# Patient Record
Sex: Female | Born: 1955 | Race: White | Hispanic: No | Marital: Married | Smoking: Current every day smoker
Health system: Southern US, Community
[De-identification: ages and names within clinical notes are randomized; demographics above are authoritative.]

## PROBLEM LIST (undated history)

## (undated) DIAGNOSIS — K635 Polyp of colon: Secondary | ICD-10-CM

## (undated) DIAGNOSIS — K648 Other hemorrhoids: Principal | ICD-10-CM

## (undated) DIAGNOSIS — J449 Chronic obstructive pulmonary disease, unspecified: Secondary | ICD-10-CM

## (undated) DIAGNOSIS — F419 Anxiety disorder, unspecified: Secondary | ICD-10-CM

## (undated) DIAGNOSIS — M722 Plantar fascial fibromatosis: Secondary | ICD-10-CM

## (undated) DIAGNOSIS — R159 Full incontinence of feces: Secondary | ICD-10-CM

## (undated) DIAGNOSIS — N809 Endometriosis, unspecified: Secondary | ICD-10-CM

## (undated) DIAGNOSIS — E559 Vitamin D deficiency, unspecified: Secondary | ICD-10-CM

## (undated) DIAGNOSIS — R87619 Unspecified abnormal cytological findings in specimens from cervix uteri: Secondary | ICD-10-CM

## (undated) DIAGNOSIS — E785 Hyperlipidemia, unspecified: Secondary | ICD-10-CM

## (undated) DIAGNOSIS — F329 Major depressive disorder, single episode, unspecified: Secondary | ICD-10-CM

## (undated) DIAGNOSIS — M459 Ankylosing spondylitis of unspecified sites in spine: Secondary | ICD-10-CM

## (undated) DIAGNOSIS — M1990 Unspecified osteoarthritis, unspecified site: Secondary | ICD-10-CM

## (undated) DIAGNOSIS — F32A Depression, unspecified: Secondary | ICD-10-CM

## (undated) DIAGNOSIS — M810 Age-related osteoporosis without current pathological fracture: Secondary | ICD-10-CM

## (undated) DIAGNOSIS — F17200 Nicotine dependence, unspecified, uncomplicated: Secondary | ICD-10-CM

## (undated) DIAGNOSIS — E119 Type 2 diabetes mellitus without complications: Secondary | ICD-10-CM

## (undated) DIAGNOSIS — R32 Unspecified urinary incontinence: Secondary | ICD-10-CM

## (undated) DIAGNOSIS — Z86010 Personal history of colonic polyps: Secondary | ICD-10-CM

## (undated) DIAGNOSIS — M8580 Other specified disorders of bone density and structure, unspecified site: Secondary | ICD-10-CM

## (undated) DIAGNOSIS — K219 Gastro-esophageal reflux disease without esophagitis: Secondary | ICD-10-CM

## (undated) DIAGNOSIS — J45909 Unspecified asthma, uncomplicated: Secondary | ICD-10-CM

## (undated) DIAGNOSIS — G43909 Migraine, unspecified, not intractable, without status migrainosus: Secondary | ICD-10-CM

## (undated) HISTORY — DX: Unspecified urinary incontinence: R32

## (undated) HISTORY — DX: Polyp of colon: K63.5

## (undated) HISTORY — DX: Age-related osteoporosis without current pathological fracture: M81.0

## (undated) HISTORY — DX: Other specified disorders of bone density and structure, unspecified site: M85.80

## (undated) HISTORY — DX: Chronic obstructive pulmonary disease, unspecified: J44.9

## (undated) HISTORY — DX: Unspecified osteoarthritis, unspecified site: M19.90

## (undated) HISTORY — DX: Unspecified abnormal cytological findings in specimens from cervix uteri: R87.619

## (undated) HISTORY — DX: Endometriosis, unspecified: N80.9

## (undated) HISTORY — DX: Major depressive disorder, single episode, unspecified: F32.9

## (undated) HISTORY — DX: Vitamin D deficiency, unspecified: E55.9

## (undated) HISTORY — DX: Hyperlipidemia, unspecified: E78.5

## (undated) HISTORY — PX: COLONOSCOPY: SHX174

## (undated) HISTORY — DX: Personal history of colonic polyps: Z86.010

## (undated) HISTORY — DX: Nicotine dependence, unspecified, uncomplicated: F17.200

## (undated) HISTORY — DX: Type 2 diabetes mellitus without complications: E11.9

## (undated) HISTORY — DX: Plantar fascial fibromatosis: M72.2

## (undated) HISTORY — PX: TONSILLECTOMY: SUR1361

## (undated) HISTORY — DX: Full incontinence of feces: R15.9

## (undated) HISTORY — DX: Unspecified asthma, uncomplicated: J45.909

## (undated) HISTORY — PX: SHOULDER SURGERY: SHX246

## (undated) HISTORY — DX: Ankylosing spondylitis of unspecified sites in spine: M45.9

## (undated) HISTORY — DX: Other hemorrhoids: K64.8

## (undated) HISTORY — DX: Depression, unspecified: F32.A

## (undated) HISTORY — DX: Anxiety disorder, unspecified: F41.9

## (undated) HISTORY — DX: Gastro-esophageal reflux disease without esophagitis: K21.9

## (undated) HISTORY — PX: UPPER GASTROINTESTINAL ENDOSCOPY: SHX188

## (undated) HISTORY — PX: CRYOTHERAPY: SHX1416

## (undated) HISTORY — DX: Migraine, unspecified, not intractable, without status migrainosus: G43.909

## (undated) HISTORY — PX: SHOULDER ARTHROSCOPY: SHX128

---

## 1984-09-12 HISTORY — PX: TOTAL ABDOMINAL HYSTERECTOMY: SHX209

## 2006-11-14 ENCOUNTER — Ambulatory Visit (HOSPITAL_COMMUNITY): Admission: RE | Admit: 2006-11-14 | Discharge: 2006-11-14 | Payer: Self-pay | Admitting: Internal Medicine

## 2007-05-16 ENCOUNTER — Ambulatory Visit: Payer: Self-pay | Admitting: Vascular Surgery

## 2007-05-30 ENCOUNTER — Encounter: Admission: RE | Admit: 2007-05-30 | Discharge: 2007-05-30 | Payer: Self-pay | Admitting: Internal Medicine

## 2011-01-11 ENCOUNTER — Encounter (HOSPITAL_COMMUNITY): Payer: Self-pay

## 2011-01-19 ENCOUNTER — Ambulatory Visit (HOSPITAL_COMMUNITY)
Admission: RE | Admit: 2011-01-19 | Discharge: 2011-01-19 | Disposition: A | Discharge status: Home / Self Care | Payer: Federal, State, Local not specified - PPO | Source: Ambulatory Visit | Attending: Internal Medicine | Admitting: Internal Medicine

## 2011-01-19 DIAGNOSIS — R0602 Shortness of breath: Secondary | ICD-10-CM | POA: Insufficient documentation

## 2011-01-19 DIAGNOSIS — F17200 Nicotine dependence, unspecified, uncomplicated: Secondary | ICD-10-CM | POA: Insufficient documentation

## 2013-01-15 ENCOUNTER — Other Ambulatory Visit: Payer: Self-pay | Admitting: Internal Medicine

## 2013-01-15 DIAGNOSIS — Z1231 Encounter for screening mammogram for malignant neoplasm of breast: Secondary | ICD-10-CM

## 2013-02-21 ENCOUNTER — Ambulatory Visit
Admission: RE | Admit: 2013-02-21 | Discharge: 2013-02-21 | Disposition: A | Discharge status: Home / Self Care | Payer: Federal, State, Local not specified - PPO | Source: Ambulatory Visit | Attending: Internal Medicine | Admitting: Internal Medicine

## 2013-02-21 DIAGNOSIS — Z1231 Encounter for screening mammogram for malignant neoplasm of breast: Secondary | ICD-10-CM

## 2013-02-22 ENCOUNTER — Other Ambulatory Visit: Payer: Self-pay | Admitting: Internal Medicine

## 2013-02-22 DIAGNOSIS — R928 Other abnormal and inconclusive findings on diagnostic imaging of breast: Secondary | ICD-10-CM

## 2013-03-07 ENCOUNTER — Ambulatory Visit
Admission: RE | Admit: 2013-03-07 | Discharge: 2013-03-07 | Disposition: A | Discharge status: Home / Self Care | Payer: Federal, State, Local not specified - PPO | Source: Ambulatory Visit | Attending: Internal Medicine | Admitting: Internal Medicine

## 2013-03-07 DIAGNOSIS — R928 Other abnormal and inconclusive findings on diagnostic imaging of breast: Secondary | ICD-10-CM

## 2013-07-12 ENCOUNTER — Other Ambulatory Visit: Payer: Self-pay | Admitting: Internal Medicine

## 2013-07-12 DIAGNOSIS — R1011 Right upper quadrant pain: Secondary | ICD-10-CM

## 2013-07-15 ENCOUNTER — Ambulatory Visit
Admission: RE | Admit: 2013-07-15 | Discharge: 2013-07-15 | Disposition: A | Discharge status: Home / Self Care | Payer: Federal, State, Local not specified - PPO | Source: Ambulatory Visit | Attending: Internal Medicine | Admitting: Internal Medicine

## 2013-07-15 DIAGNOSIS — R1011 Right upper quadrant pain: Secondary | ICD-10-CM

## 2014-01-23 ENCOUNTER — Other Ambulatory Visit (HOSPITAL_COMMUNITY): Payer: Self-pay | Admitting: Internal Medicine

## 2014-01-23 ENCOUNTER — Other Ambulatory Visit: Payer: Self-pay | Admitting: Internal Medicine

## 2014-01-23 DIAGNOSIS — R1011 Right upper quadrant pain: Secondary | ICD-10-CM

## 2014-01-23 DIAGNOSIS — F17200 Nicotine dependence, unspecified, uncomplicated: Secondary | ICD-10-CM

## 2014-01-24 ENCOUNTER — Encounter: Payer: Self-pay | Admitting: Internal Medicine

## 2014-01-27 ENCOUNTER — Ambulatory Visit
Admission: RE | Admit: 2014-01-27 | Discharge: 2014-01-27 | Disposition: A | Discharge status: Home / Self Care | Payer: No Typology Code available for payment source | Source: Ambulatory Visit | Attending: Internal Medicine | Admitting: Internal Medicine

## 2014-01-27 DIAGNOSIS — F17200 Nicotine dependence, unspecified, uncomplicated: Secondary | ICD-10-CM

## 2014-02-07 ENCOUNTER — Ambulatory Visit (HOSPITAL_COMMUNITY)
Admission: RE | Admit: 2014-02-07 | Discharge: 2014-02-07 | Disposition: A | Discharge status: Home / Self Care | Payer: Federal, State, Local not specified - PPO | Source: Ambulatory Visit | Attending: Internal Medicine | Admitting: Internal Medicine

## 2014-02-07 DIAGNOSIS — R1011 Right upper quadrant pain: Secondary | ICD-10-CM

## 2014-02-07 MED ORDER — TECHNETIUM TC 99M MEBROFENIN IV KIT
4.9000 | PACK | Freq: Once | INTRAVENOUS | Status: AC | PRN
  Administered 2014-02-07: 5 via INTRAVENOUS

## 2014-02-07 MED ORDER — SINCALIDE 5 MCG IJ SOLR
0.0200 ug/kg | Freq: Once | INTRAMUSCULAR | Status: AC
  Administered 2014-02-07: 1.6 ug via INTRAVENOUS

## 2014-03-20 ENCOUNTER — Ambulatory Visit: Payer: Federal, State, Local not specified - PPO | Admitting: Internal Medicine

## 2014-07-05 ENCOUNTER — Ambulatory Visit (INDEPENDENT_AMBULATORY_CARE_PROVIDER_SITE_OTHER): Payer: Federal, State, Local not specified - PPO | Admitting: Podiatry

## 2014-07-05 ENCOUNTER — Ambulatory Visit (INDEPENDENT_AMBULATORY_CARE_PROVIDER_SITE_OTHER): Payer: Federal, State, Local not specified - PPO

## 2014-07-05 ENCOUNTER — Encounter: Payer: Self-pay | Admitting: Podiatry

## 2014-07-05 VITALS — BP 126/79 | HR 86 | Resp 18

## 2014-07-05 DIAGNOSIS — R52 Pain, unspecified: Secondary | ICD-10-CM

## 2014-07-05 DIAGNOSIS — M795 Residual foreign body in soft tissue: Secondary | ICD-10-CM

## 2014-07-05 NOTE — Progress Notes (Signed)
   Subjective:    Patient ID: Candace Medina, female    DOB: [birth year], 57 y.o.   MRN: [id number]  HPI  She presents the office today with complaints of a painful callus on the ball of her right foot. She states that she may have a piece of glass in her right foot. She states that in August there was broken glass on the ground and she may have stepped on a piece. She denies any areas of bleeding or notice any pain at that time. She has recently developed a callus over the area. She states that she has pain in this area which is intermittent. No other complaints at this time.     Review of Systems  Genitourinary: Positive for frequency.  Musculoskeletal: Positive for back pain and gait problem.       Joint pain  Allergic/Immunologic: Positive for environmental allergies.  All other systems reviewed and are negative.      Objective:   Physical Exam AAO x3, NAD DP/PT pulses palpable bilaterally, CRT less than 3 seconds Protective sensation intact with Simms Weinstein monofilament, vibratory sensation intact, Achilles tendon reflex intact Hyperkeratotic lesion right foot submetatarsal 5 and along lateral MTPJ. Upon debridement of the hyperkeratotic lesion underlying skin intact. There is no erythema, edema. There is tenderness directly over the lesion. No pinpoint bony tenderness or pain with vibratory sensation. Small hyperkeratotic lesion right third digit. Upon debridement underlying skin intact. No surrounding erythema or drainage. MMT 5/5, ROM WNL pain, swelling, warmth, erythema.      Assessment & Plan:  58 year old female with right submetatarsal 5 hyperkeratotic lesion possible resideual foreign body. -X-rays were obtained and reviewed the patient. -Conservative versus surgical treatment discussed including alternatives, risks, combinations. -Hyperkeratotic lesions sharply debrided without competitions. -Will obtain ultrasound to evaluate for possible foreign body. -Monitor  for any clinical signs or symptoms of infection and directed to call the office immediately if any are to occur ago directly to the emergency room. -Follow-up of the ultrasound. In the meantime call the office with any questions, concerns, change in symptoms. Follow-up with PCP for other issues mentioned in the review of systems.

## 2014-07-09 ENCOUNTER — Telehealth: Payer: Self-pay | Admitting: Podiatry

## 2014-07-09 NOTE — Telephone Encounter (Signed)
Patient called stating she was seen on Saturday by Dr. Jacqualyn Posey and they were supposed to set up an appointment for an ultra sound and was told if she had not heard anything by Tuesday afternoon to call back. Patient is wanting to know the status and if an appointment has been scheduled. I told the patient I would give a message to [facility] and have Delydia return her call.

## 2014-07-10 ENCOUNTER — Telehealth: Payer: Self-pay | Admitting: *Deleted

## 2014-07-10 NOTE — Telephone Encounter (Signed)
I called and informed her that I just sent the referral for the MRI to [facility].  Dr. Jacqualyn Posey put the order in on the 24th but they don't check their ques.  You should hear something from them hopefully today to get that scheduled.  I apologized to her.  "It's not your fault, thank you."

## 2014-07-11 ENCOUNTER — Ambulatory Visit
Admission: RE | Admit: 2014-07-11 | Discharge: 2014-07-11 | Disposition: A | Discharge status: Home / Self Care | Payer: Federal, State, Local not specified - PPO | Source: Ambulatory Visit | Attending: Podiatry | Admitting: Podiatry

## 2014-07-11 DIAGNOSIS — R52 Pain, unspecified: Secondary | ICD-10-CM

## 2014-07-11 DIAGNOSIS — M795 Residual foreign body in soft tissue: Secondary | ICD-10-CM

## 2014-07-23 ENCOUNTER — Ambulatory Visit (INDEPENDENT_AMBULATORY_CARE_PROVIDER_SITE_OTHER): Payer: Federal, State, Local not specified - PPO | Admitting: Podiatry

## 2014-07-23 VITALS — BP 129/87 | HR 94 | Temp 99.1°F | Resp 12

## 2014-07-23 DIAGNOSIS — L603 Nail dystrophy: Secondary | ICD-10-CM

## 2014-07-23 DIAGNOSIS — M795 Residual foreign body in soft tissue: Secondary | ICD-10-CM

## 2014-07-23 NOTE — Progress Notes (Signed)
Patient ID: Candace Medina, female   DOB: May 29, 1956, 58 y.o.   MRN: [id number]  Subjective: 58 year old female returns the office today for follow-up evaluation of right submetatarsal 5 pain into discussed ultrasound results. She states that she does not have much pain in the area and was shoe. Direct pressure over top of it. She states that since last appointment after the debridement of the skin she had decreased pain in the area. Denies any acute changes his last appointment. Denies any systemic complaints as fevers, chills, nausea, vomiting. Patient also has secondary complaints of right second digit toenail thickening and discoloration. She denies any previous treatments denies any redness or drainage from the nail sites. No other complaints at this time.  Objective: AAO x3, NAD DP/PT pulses palpable bilaterally, CRT less than 3 seconds Protective sensation intact with Simms Weinstein monofilament, vibratory sensation intact, Achilles tendon reflex intact Hyperkeratotic lesion on the plantar lateral aspect of the right foot submetatarsal 5. There is no surrounding erythema, drainage, fluctuance, crepitus, ascending cellulitis. There is mild tenderness upon direct palpation over this area. There is no pain with vibratory sensation are pinpoint bony tenderness. Right second digit nail hypertrophic, dystrophic, discolored, brittle without any surrounding erythema or drainage. No open lesions. MMT 5/5, ROM WNL No calf pain, swelling, warmth, erythema.  Assessment: 58 year old female with likely residual right foot foreign body; right second digit onychodystrophy.  Plan: -Ultrasound results were reviewed with the patient. Just inferior to the region there is a 3 mm focus which may represent a foreign body. Also again discussed with her the x-ray findings which show a possible foreign body submetatarsal 5. -I discussed with her various treatment options for this. Patient states that she does not  recall stepping on anything she just feels as if there is something inside of there. Based on these findings can try to remove the foreign body. At this time though the patient does not want to proceed with surgical intervention and would like to see if the area would "work itself out". I discussed with her that having a residual foreign body could lead to infection and other complications. She understands this. I discussed with her that if she has any increased pain, swelling, erythema to the area to call the office immediately or go to the ER. -For the right second digit nail there is dystrophy of the nail with discoloration. The nail was biopsied and sent to [facility] labs for evaluation of possible nail fungus. Discussed various she options for nail fungus however we will hold off on starting any treatment until results of the culture are obtained. -Follow-up after biopsy results. In the meantime call the office with any questions, concerns, change in symptoms.

## 2014-07-23 NOTE — Patient Instructions (Signed)

## 2014-08-18 ENCOUNTER — Encounter: Payer: Self-pay | Admitting: Podiatry

## 2014-09-24 ENCOUNTER — Ambulatory Visit: Payer: Federal, State, Local not specified - PPO | Admitting: Internal Medicine

## 2014-10-23 ENCOUNTER — Encounter: Payer: Self-pay | Admitting: Internal Medicine

## 2014-10-23 ENCOUNTER — Ambulatory Visit (INDEPENDENT_AMBULATORY_CARE_PROVIDER_SITE_OTHER): Payer: Federal, State, Local not specified - PPO | Admitting: Internal Medicine

## 2014-10-23 VITALS — BP 122/86 | HR 96 | Ht 61.75 in | Wt 184.2 lb

## 2014-10-23 DIAGNOSIS — R194 Change in bowel habit: Secondary | ICD-10-CM

## 2014-10-23 DIAGNOSIS — R159 Full incontinence of feces: Secondary | ICD-10-CM

## 2014-10-23 DIAGNOSIS — K5900 Constipation, unspecified: Secondary | ICD-10-CM

## 2014-10-23 DIAGNOSIS — R197 Diarrhea, unspecified: Secondary | ICD-10-CM

## 2014-10-23 DIAGNOSIS — R1011 Right upper quadrant pain: Secondary | ICD-10-CM

## 2014-10-23 DIAGNOSIS — K648 Other hemorrhoids: Secondary | ICD-10-CM

## 2014-10-23 NOTE — Progress Notes (Signed)
Referred by Dr. Lang Snow Subjective:    Patient ID: Candace Medina, female    DOB: [birth year], 59 y.o.   MRN: [id number] Chief complaints: Constipation and diarrhea with fecal incontinence, but upper quadrant pain HPI The patient is a very nice middle-aged woman with ankylosing spondylitis, and a 1 year history of constipation problems and diarrhea. She says that she will be constipated for up to 2 weeks at a time and then have multiple loose stools over the next several days her up to a week. It sounds like she may have had constipation troubles in the past. She saw a gastroenterologist and [facility] over a decade ago and had an upper endoscopy and a colonoscopy when she was having gas problems and she had irritable bowel syndrome. Dr. Brigitte Pulse recently suggested align on a daily basis and Metamucil, but she does not think that has helped her. She does think she had 5 polyps over a decade ago when she had her colonoscopy.  He is also describing a postprandial right upper quadrant pain usually within an hour after eating. Not severe but it is very bothersome. Gallbladder ultrasound and HIDA scan have been negative for problems. She does relate that the CCK injection reproduced her pain.  She also complains of a lot of bloating and gassy symptoms. Flatulence is an issue.  Allergies  Allergen Reactions  . Venomil Honey Bee Venom [Honey Bee Venom] Anaphylaxis    ALL VENOMOUS INSECTS   . Boniva [Ibandronic Acid]     Couldn't walk  . Keflex [Cephalexin] Itching  . Codeine Rash  . Morphine And Related Rash   Outpatient Prescriptions Prior to Visit  Medication Sig Dispense Refill  . albuterol (PROVENTIL HFA;VENTOLIN HFA) 108 (90 BASE) MCG/ACT inhaler Inhale into the lungs every 6 (six) hours as needed for wheezing or shortness of breath.    . ALPRAZolam (XANAX) 0.5 MG tablet Take 0.5 mg by mouth.    . budesonide-formoterol (SYMBICORT) 80-4.5 MCG/ACT inhaler Inhale 2 puffs into the lungs 2 (two)  times daily.    Marland Kitchen buPROPion (WELLBUTRIN XL) 300 MG 24 hr tablet Take 300 mg by mouth daily.    . ergocalciferol (VITAMIN D2) 50000 UNITS capsule Take 50,000 Units by mouth 2 (two) times a week.     . etanercept (ENBREL) 50 MG/ML injection Inject 50 mg into the skin once a week.    . etodolac (LODINE) 500 MG tablet Take 500 mg by mouth 2 (two) times daily.    . fluticasone (FLONASE) 50 MCG/ACT nasal spray Place 1 spray into the nose.    . simvastatin (ZOCOR) 20 MG tablet Take 20 mg by mouth daily.    . traMADol (ULTRAM) 50 MG tablet Take by mouth every 6 (six) hours as needed.    Marland Kitchen omeprazole (PRILOSEC) 10 MG capsule Take 10 mg by mouth.    . phentermine 15 MG capsule Take 15 mg by mouth.    . tolterodine (DETROL) 1 MG tablet Take 1 mg by mouth.     No facility-administered medications prior to visit.   Past Medical History  Diagnosis Date  . Arthritis   . Asthma   . COPD (chronic obstructive pulmonary disease)   . Migraines   . Ankylosing spondylitis   . Nicotine addiction   . Osteopenia   . Anxiety and depression   . Hyperlipidemia   . Vitamin D deficiency   . Colon polyps    Past Surgical History  Procedure Laterality Date  . Total  abdominal hysterectomy  1986  . Tonsillectomy    . Shoulder surgery Left   . Shoulder arthroscopy Right     bursitis  . Colonoscopy    . Upper gastrointestinal endoscopy     History   Social History  . Marital Status: Married    Spouse Name: N/A  . Number of Children: 2  . Years of Education: N/A   Occupational History  . [employer]    Social History Main Topics  . Smoking status: Current Every Day Smoker -- 31 years    Types: Cigarettes  . Smokeless tobacco: Never Used  . Alcohol Use: No  . Drug Use: No  . Sexual Activity: Not on file   Other Topics Concern  . None   Social History Narrative   She is married, she has 1 son and one daughter   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[employer]   Drinks 3 caffeinated beverages  daily   She enjoys painting as a hobby   3 grandchildren, she has custody of 1 granddaughter   Some college education   Family History  Problem Relation Age of Onset  . Bladder Cancer Father   . Esophageal cancer Mother   . Melanoma Brother   . Heart disease Other     mother's entire family-13 Bro and Sis  . Brain cancer Paternal Aunt   . Lung cancer Paternal Uncle   . Bone cancer Paternal Uncle   . Lung cancer Paternal Aunt    Review of Systems Recently started Augmentin for a sinus infection. Has back pain problems which are much better since she was diagnosed with ankylosing spondylitis and started Enbrel. Some problems with night sweats and fatigue. Some other joint symptoms in her hands.    Objective:   Physical Exam General:  Well-developed, well-nourished and in no acute distress Eyes:  anicteric. ENT:   Mouth and posterior pharynx free of lesions.  Neck:   supple w/o thyromegaly or mass.  Lungs: Clear to auscultation bilaterally. Heart:  S1S2, no rubs, murmurs, gallops. Abdomen:  soft, non-tender, no hepatosplenomegaly, hernia, or mass and BS+.  Rectal: Patti [place], Boyd present.  Anoderm inspection revealed 2 anal tags and some scanty perianal stool Anal wink was present Digital exam revealed normal resting tone and voluntary squeeze. Not tender, palpable hemorrhoids in canal No mass but smallrectocele present. Simulated defecation with valsalva revealed appropriate abdominal contraction and descent.   Lymph:  no cervical or supraclavicular adenopathy. Extremities:   no edema Skin   no rash. Neuro:  A&O x 3.  Psych:  appropriate mood and  Affect.   Data Reviewed: Primary care note of 07/28/2014, ultrasound and HIDA scan reports from 2015 including imaging review, Main finding probable fatty infiltration of the liver    Assessment & Plan:  Constipation, unspecified constipation type Diarrhea Change in bowel habits  Hemorrhoids, internal, with bleeding RUQ  pain - Fecal incontinence  1. Try daily MiraLax, stop align and metamucil. Things she may have a constipation predominant problem and need to promote more effective defecation and since the Metamucil did not do that we'll try the MiraLAX. Hopefully she will have more regular defecation and not spells of constipation and diarrhea. 2. EGD to evaluate RUQ pain 3. Colonoscopy for change in bowels question inflammatory bowel disease versus IBS. The risks and benefits as well as alternatives of endoscopic procedure(s) have been discussed and reviewed. All questions answered. The patient agrees to proceed. After this workup is done  we can consider hemorrhoidal banding. This may have something to do with her incontinence problems. Banding information provided to the patient.  I appreciate the opportunity to care for this patient. CC: Marton Redwood, MD

## 2014-10-23 NOTE — Patient Instructions (Addendum)
You have been given a separate informational sheet regarding your tobacco use, the importance of quitting and local resources to help you quit.  You may have a light breakfast the morning of prep day (the day before the procedure). You may choose from: eggs, toast, chicken noodle soup, crackers.  You should have your breakfast completed between 8:00 and 9:00 am.  Clear liquids only for the rest of the day on prep day and up until 3 hours before procedure.    You have been scheduled for an endoscopy and colonoscopy. Please follow the written instructions given to you at your visit today. Please pick up your prep supplies at the pharmacy. If you use inhalers (even only as needed), please bring them with you on the day of your procedure.  Take Miralax daily and hold for diarrhea.  Stop the Align and metamucil.   I appreciate the opportunity to care for you. Silvano Rusk, M.D., [facility]

## 2014-11-05 ENCOUNTER — Encounter: Payer: Self-pay | Admitting: Internal Medicine

## 2014-12-12 ENCOUNTER — Ambulatory Visit (INDEPENDENT_AMBULATORY_CARE_PROVIDER_SITE_OTHER): Payer: Federal, State, Local not specified - PPO | Admitting: Podiatry

## 2014-12-12 ENCOUNTER — Ambulatory Visit (INDEPENDENT_AMBULATORY_CARE_PROVIDER_SITE_OTHER): Payer: Federal, State, Local not specified - PPO

## 2014-12-12 DIAGNOSIS — B351 Tinea unguium: Secondary | ICD-10-CM

## 2014-12-12 DIAGNOSIS — L84 Corns and callosities: Secondary | ICD-10-CM | POA: Diagnosis not present

## 2014-12-12 DIAGNOSIS — R52 Pain, unspecified: Secondary | ICD-10-CM | POA: Diagnosis not present

## 2014-12-12 DIAGNOSIS — M722 Plantar fascial fibromatosis: Secondary | ICD-10-CM | POA: Diagnosis not present

## 2014-12-12 MED ORDER — TRIAMCINOLONE ACETONIDE 10 MG/ML IJ SUSP
10.0000 mg | Freq: Once | INTRAMUSCULAR | Status: AC
  Administered 2014-12-12: 10 mg

## 2014-12-12 MED ORDER — EFINACONAZOLE 10 % EX SOLN: 1.0000 [drp] | Freq: Every day | CUTANEOUS | Status: DC

## 2014-12-12 NOTE — Progress Notes (Signed)
Subjective:    Patient ID: Candace Medina, female    DOB: 05/02/1956, 59 y.o.   MRN: [id number]  HPI 59 year old female presents the office today with complaints of right heel pain which been ongoing for approximately 3 weeks. She states that she has pain first step in the morning which is relieved by ambulation or after periods of inactivity. She states that she has been using salonpas which helped some. She denies any history of injury or trauma to the area and denies any change or increase in activity the time of onset of symptoms. She denies any numbness or tingling. The pain does not wake her up at night. Denies any swelling or redness.  Also states that she has a painful lesion still on the bottom of her right third toe protective pressure. She states that the areas trimmed it feels better but it reoccurs. Denies any redness or drainage.  Also states that she would like to have treatment for nail fungus. Although the results of the culture did not reveal nail fungus she would try fungal treatment. Denies any pain associated the nails or any redness or drainage. No other complaints at this time.   Review of Systems  Musculoskeletal: Positive for gait problem.  All other systems reviewed and are negative.      Objective:   Physical Exam AAO x3, NAD DP/PT pulses palpable bilaterally, CRT less than 3 seconds Protective sensation intact with Simms Weinstein monofilament, vibratory sensation intact, Achilles tendon reflex intact Tenderness to palpation overlying the plantar medial tubercle of the calcaneus to right heel at the insertion of the plantar fascia. There is no pain along the course of plantar fascial within the arch of the foot. There is no pain with lateral compression of the calcaneus or pain the vibratory sensation. No pain on the posterior aspect of the calcaneus or along the course/insertion of the Achilles tendon. There is no overlying edema, erythema, increase in warmth.  No other areas of tenderness palpation or pain with vibratory sensation to the foot/ankle. MMT 5/5, ROM WNL On the distal plantar aspect of the right third toe there is a small hyperkeratotic lesion. Upon debridement there is no underlying ulceration, drainage, signs of infection. There is no pinpoint bleeding or evidence of verruca. There is underlying adductovarus deformity of the digit. Nails do appear to be dystrophic, discolored, hypertrophic. There is no tenderness associated with the nail there is no surrounding erythema or drainage. No open lesions or other pre-ulcerative lesions are identified. No pain with calf compression, swelling, warmth, erythema.        Assessment & Plan:  59 year old female with right heel pain, likely plantar fasciitis; hyperkeratotic lesion right third toe; onychodystrophy -Treatment options discussed include alternatives, risks, complications. 1. Heel pain- likely a result of plantar fasciitis. -X-rays were obtained and reviewed -Patient elects to proceed with steroid injection into the right heel. Under sterile skin preparation, a total of 2.5cc of kenalog 10, 0.5% Marcaine plain, and 2% lidocaine plain were infiltrated into the symptomatic area without complication. A band-aid was applied. Patient tolerated the injection well without complication. Post-injection care with discussed with the patient. Discussed with the patient to ice the area over the next couple of days to help prevent a steroid flare.  -Dispensed plantar fascial brace -Stretching activities -Ice to the area -Night splint -Shoe gear modifications and possible orthotics discussed  2. Hyperkerotic lesion 3rd toe- -debrided without complication/bleeding  3. Onychodystrophy -Although the results the nail biopsy did  not reveal onychomycosis another clinical signs associated with onychomycosis. She would like to have treatment for onychomycosis although she is aware of the nail biopsy  results. Prescription for Jublia was sent to the patients pharmacy. Discussed side effects the medication as well as had applied.  -Follow-up in 3 weeks or sooner if any problems are to arise. In the meantime encouraged to call the office with any questions, concerns, change in symptoms.

## 2014-12-12 NOTE — Patient Instructions (Signed)
Plantar Fasciitis (Heel Spur Syndrome) with Rehab The plantar fascia is a fibrous, ligament-like, soft-tissue structure that spans the bottom of the foot. Plantar fasciitis is a condition that causes pain in the foot due to inflammation of the tissue. SYMPTOMS   Pain and tenderness on the underneath side of the foot.  Pain that worsens with standing or walking. CAUSES  Plantar fasciitis is caused by irritation and injury to the plantar fascia on the underneath side of the foot. Common mechanisms of injury include:  Direct trauma to bottom of the foot.  Damage to a small nerve that runs under the foot where the main fascia attaches to the heel bone.  Stress placed on the plantar fascia due to bone spurs. RISK INCREASES WITH:   Activities that place stress on the plantar fascia (running, jumping, pivoting, or cutting).  Poor strength and flexibility.  Improperly fitted shoes.  Tight calf muscles.  Flat feet.  Failure to warm-up properly before activity.  Obesity. PREVENTION  Warm up and stretch properly before activity.  Allow for adequate recovery between workouts.  Maintain physical fitness:  Strength, flexibility, and endurance.  Cardiovascular fitness.  Maintain a health body weight.  Avoid stress on the plantar fascia.  Wear properly fitted shoes, including arch supports for individuals who have flat feet. PROGNOSIS  If treated properly, then the symptoms of plantar fasciitis usually resolve without surgery. However, occasionally surgery is necessary. RELATED COMPLICATIONS   Recurrent symptoms that may result in a chronic condition.  Problems of the lower back that are caused by compensating for the injury, such as limping.  Pain or weakness of the foot during push-off following surgery.  Chronic inflammation, scarring, and partial or complete fascia tear, occurring more often from repeated injections. TREATMENT  Treatment initially involves the use of  ice and medication to help reduce pain and inflammation. The use of strengthening and stretching exercises may help reduce pain with activity, especially stretches of the Achilles tendon. These exercises may be performed at home or with a therapist. Your caregiver may recommend that you use heel cups of arch supports to help reduce stress on the plantar fascia. Occasionally, corticosteroid injections are given to reduce inflammation. If symptoms persist for greater than 6 months despite non-surgical (conservative), then surgery may be recommended.  MEDICATION   If pain medication is necessary, then nonsteroidal anti-inflammatory medications, such as aspirin and ibuprofen, or other minor pain relievers, such as acetaminophen, are often recommended.  Do not take pain medication within 7 days before surgery.  Prescription pain relievers may be given if deemed necessary by your caregiver. Use only as directed and only as much as you need.  Corticosteroid injections may be given by your caregiver. These injections should be reserved for the most serious cases, because they may only be given a certain number of times. HEAT AND COLD  Cold treatment (icing) relieves pain and reduces inflammation. Cold treatment should be applied for 10 to 15 minutes every 2 to 3 hours for inflammation and pain and immediately after any activity that aggravates your symptoms. Use ice packs or massage the area with a piece of ice (ice massage).  Heat treatment may be used prior to performing the stretching and strengthening activities prescribed by your caregiver, physical therapist, or athletic trainer. Use a heat pack or soak the injury in warm water. SEEK IMMEDIATE MEDICAL CARE IF:  Treatment seems to offer no benefit, or the condition worsens.  Any medications produce adverse side effects. EXERCISES RANGE   OF MOTION (ROM) AND STRETCHING EXERCISES - Plantar Fasciitis (Heel Spur Syndrome) These exercises may help you  when beginning to rehabilitate your injury. Your symptoms may resolve with or without further involvement from your physician, physical therapist or athletic trainer. While completing these exercises, remember:   Restoring tissue flexibility helps normal motion to return to the joints. This allows healthier, less painful movement and activity.  An effective stretch should be held for at least 30 seconds.  A stretch should never be painful. You should only feel a gentle lengthening or release in the stretched tissue. RANGE OF MOTION - Toe Extension, Flexion  Sit with your right / left leg crossed over your opposite knee.  Grasp your toes and gently pull them back toward the top of your foot. You should feel a stretch on the bottom of your toes and/or foot.  Hold this stretch for __________ seconds.  Now, gently pull your toes toward the bottom of your foot. You should feel a stretch on the top of your toes and or foot.  Hold this stretch for __________ seconds. Repeat __________ times. Complete this stretch __________ times per day.  RANGE OF MOTION - Ankle Dorsiflexion, Active Assisted  Remove shoes and sit on a chair that is preferably not on a carpeted surface.  Place right / left foot under knee. Extend your opposite leg for support.  Keeping your heel down, slide your right / left foot back toward the chair until you feel a stretch at your ankle or calf. If you do not feel a stretch, slide your bottom forward to the edge of the chair, while still keeping your heel down.  Hold this stretch for __________ seconds. Repeat __________ times. Complete this stretch __________ times per day.  STRETCH - Gastroc, Standing  Place hands on wall.  Extend right / left leg, keeping the front knee somewhat bent.  Slightly point your toes inward on your back foot.  Keeping your right / left heel on the floor and your knee straight, shift your weight toward the wall, not allowing your back to  arch.  You should feel a gentle stretch in the right / left calf. Hold this position for __________ seconds. Repeat __________ times. Complete this stretch __________ times per day. STRETCH - Soleus, Standing  Place hands on wall.  Extend right / left leg, keeping the other knee somewhat bent.  Slightly point your toes inward on your back foot.  Keep your right / left heel on the floor, bend your back knee, and slightly shift your weight over the back leg so that you feel a gentle stretch deep in your back calf.  Hold this position for __________ seconds. Repeat __________ times. Complete this stretch __________ times per day. STRETCH - Gastrocsoleus, Standing  Note: This exercise can place a lot of stress on your foot and ankle. Please complete this exercise only if specifically instructed by your caregiver.   Place the ball of your right / left foot on a step, keeping your other foot firmly on the same step.  Hold on to the wall or a rail for balance.  Slowly lift your other foot, allowing your body weight to press your heel down over the edge of the step.  You should feel a stretch in your right / left calf.  Hold this position for __________ seconds.  Repeat this exercise with a slight bend in your right / left knee. Repeat __________ times. Complete this stretch __________ times per day.    STRENGTHENING EXERCISES - Plantar Fasciitis (Heel Spur Syndrome)  These exercises may help you when beginning to rehabilitate your injury. They may resolve your symptoms with or without further involvement from your physician, physical therapist or athletic trainer. While completing these exercises, remember:   Muscles can gain both the endurance and the strength needed for everyday activities through controlled exercises.  Complete these exercises as instructed by your physician, physical therapist or athletic trainer. Progress the resistance and repetitions only as guided. STRENGTH -  Towel Curls  Sit in a chair positioned on a non-carpeted surface.  Place your foot on a towel, keeping your heel on the floor.  Pull the towel toward your heel by only curling your toes. Keep your heel on the floor.  If instructed by your physician, physical therapist or athletic trainer, add ____________________ at the end of the towel. Repeat __________ times. Complete this exercise __________ times per day. STRENGTH - Ankle Inversion  Secure one end of a rubber exercise band/tubing to a fixed object (table, pole). Loop the other end around your foot just before your toes.  Place your fists between your knees. This will focus your strengthening at your ankle.  Slowly, pull your big toe up and in, making sure the band/tubing is positioned to resist the entire motion.  Hold this position for __________ seconds.  Have your muscles resist the band/tubing as it slowly pulls your foot back to the starting position. Repeat __________ times. Complete this exercises __________ times per day.  Document Released: 08/29/2005 Document Revised: 11/21/2011 Document Reviewed: 12/11/2008 Exit[employer] Patient Information 2015 [employer]. This information is not intended to replace advice given to you by your health care provider. Make sure you discuss any questions you have with your health care provider.  

## 2014-12-17 ENCOUNTER — Encounter: Payer: Self-pay | Admitting: Internal Medicine

## 2014-12-17 ENCOUNTER — Ambulatory Visit (AMBULATORY_SURGERY_CENTER): Payer: Federal, State, Local not specified - PPO | Admitting: Internal Medicine

## 2014-12-17 VITALS — BP 142/78 | HR 73 | Temp 97.9°F | Resp 18 | Ht 61.0 in | Wt 184.0 lb

## 2014-12-17 DIAGNOSIS — D125 Benign neoplasm of sigmoid colon: Secondary | ICD-10-CM

## 2014-12-17 DIAGNOSIS — K635 Polyp of colon: Secondary | ICD-10-CM | POA: Diagnosis present

## 2014-12-17 DIAGNOSIS — D122 Benign neoplasm of ascending colon: Secondary | ICD-10-CM

## 2014-12-17 DIAGNOSIS — K648 Other hemorrhoids: Secondary | ICD-10-CM | POA: Insufficient documentation

## 2014-12-17 DIAGNOSIS — Z1211 Encounter for screening for malignant neoplasm of colon: Secondary | ICD-10-CM

## 2014-12-17 DIAGNOSIS — R1011 Right upper quadrant pain: Secondary | ICD-10-CM | POA: Insufficient documentation

## 2014-12-17 HISTORY — DX: Other hemorrhoids: K64.8

## 2014-12-17 MED ORDER — HYOSCYAMINE SULFATE 0.125 MG SL SUBL: 0.1250 mg | SUBLINGUAL_TABLET | SUBLINGUAL | Status: DC | PRN

## 2014-12-17 MED ORDER — SODIUM CHLORIDE 0.9 % IV SOLN: 500.0000 mL | INTRAVENOUS | Status: DC

## 2014-12-17 NOTE — Op Note (Signed)
[place]  [address] [place], [id number]   COLONOSCOPY PROCEDURE REPORT  PATIENT: Candace Medina, Candace Medina  MR#: [id number] BIRTHDATE: 1956-03-22 , 42  yrs. old GENDER: female ENDOSCOPIST: Gatha Mayer, MD, [facility] PROCEDURE DATE:  12/17/2014 PROCEDURE:   Colonoscopy with snare polypectomy First Screening Colonoscopy - Avg.  risk and is 50 yrs.  old or older - No.  Prior Negative Screening - Now for repeat screening. N/A  History of Adenoma - Now for follow-up colonoscopy & has been > or = to 3 yrs.  N/A ASA CLASS:   Class III INDICATIONS:Screening for colonic neoplasia and Colorectal Neoplasm Risk Assessment for this procedure is average risk. MEDICATIONS: Residual sedation present, Propofol 250 mg IV, and Monitored anesthesia care  DESCRIPTION OF PROCEDURE:   After the risks benefits and alternatives of the procedure were thoroughly explained, informed consent was obtained.  The digital rectal exam revealed decreased sphincter tone and revealed no rectal mass.   The LB [id number]  endoscope was introduced through the anus and advanced to the cecum, which was identified by both the appendix and ileocecal valve. No adverse events experienced.   The quality of the prep was excellent.  (MiraLax was used)  The instrument was then slowly withdrawn as the colon was fully examined.  COLON FINDINGS: A smooth sessile polyp measuring 18 mm in size with a mucous cap was found in the ascending colon.  A polypectomy was performed in a piecemeal fashion using snare cautery.  The resection was complete, the polyp tissue was completely retrieved and sent to histology.   A smooth sessile polyp measuring 4 mm in size was found in the sigmoid colon.  A polypectomy was performed with a cold snare.  The resection was complete, the polyp tissue was completely retrieved and sent to histology.   There was diverticulosis noted throughout the entire examined colon. Internal  hemorrhoids were found.   The examination was otherwise normal.  Retroflexed views revealed internal hemorrhoids. The time to cecum = 0.3 Withdrawal time = 24.3   The scope was withdrawn and the procedure completed. COMPLICATIONS: There were no immediate complications.  ENDOSCOPIC IMPRESSION: 1.   Sessile polyp (35mm) was found in the ascending colon; polypectomy was performed in a piecemeal fashion using snare cautery 2.   Sessile polyp (4 mm)  was found in the sigmoid colon; polypectomy was performed with a cold snare 3.   Diverticulosis was noted throughout the entire examined colon 4.   Internal hemorrhoids 5.   The examination was otherwise normal - excellent prep  RECOMMENDATIONS: 1.  Hold Aspirin and all other NSAIDS for 2 weeks. May restart 12/31/14 2.  My office will arange hemrrhoid banding to treat fecal soiling thought from prolapsed hemorrhoids 3.  Timing of repeat colonoscopy will be determined by pathology findings. eSigned:  Gatha Mayer, MD, [facility] 12/17/2014 4:00 PM   cc: Janalyn Rouse, MD and [name]

## 2014-12-17 NOTE — Op Note (Signed)
[place]  [address] [place], [id number]   ENDOSCOPY PROCEDURE REPORT  PATIENT: Candace Medina, Candace Medina  MR#: [id number] BIRTHDATE: 05/12/1956 , 32  yrs. old GENDER: female ENDOSCOPIST: Gatha Mayer, MD, [facility] PROCEDURE DATE:  12/17/2014 PROCEDURE:  EGD, diagnostic ASA CLASS:     Class III INDICATIONS:  abdominal pain in the upper right quadrant. MEDICATIONS: Propofol 150 mg IV and Monitored anesthesia care TOPICAL ANESTHETIC: none  DESCRIPTION OF PROCEDURE: After the risks benefits and alternatives of the procedure were thoroughly explained, informed consent was obtained.  The LB [id number] endoscope was introduced through the mouth and advanced to the second portion of the duodenum , Without limitations.  The instrument was slowly withdrawn as the mucosa was fully examined.   EXAM: The esophagus and gastroesophageal junction were completely normal in appearance.  The stomach was entered and closely examined.The antrum, angularis, and lesser curvature were well visualized, including a retroflexed view of the cardia and fundus. The stomach wall was normally distensable.  The scope passed easily through the pylorus into the duodenum.  Retroflexed views revealed no abnormalities.     The scope was then withdrawn from the patient and the procedure completed.  COMPLICATIONS: There were no immediate complications.  ENDOSCOPIC IMPRESSION: Normal appearing esophagus and GE junction, the stomach was well visualized and normal in appearance, normal appearing duodenum  RECOMMENDATIONS: Trial of hyoscyamine prn RUQ pain  consider [facility] consultation re: RUQ pain - possible biliary colic w/ negative imaging except CCK infusion did reproduce pain at HIDA w/ EF (otherwise NL)  Colonoscopy next  eSigned:  Gatha Mayer, MD, [facility] 12/17/2014 3:51 PM    CC: Janalyn Rouse, MD and [facility]

## 2014-12-17 NOTE — Progress Notes (Signed)
Report to PACU, RN, vss, BBS= Clear.  

## 2014-12-17 NOTE — Progress Notes (Signed)
Called to room to assist during endoscopic procedure.  Patient ID and intended procedure confirmed with present staff. Received instructions for my participation in the procedure from the performing physician.  

## 2014-12-17 NOTE — Patient Instructions (Addendum)
The esophagus, stomach and duodenum looked normal - no source of the abdominal pain seen.  The pain could be from spasms of the intestines or stomach so I am going to prescribe a medication called hyoscyamine to take when you get the pain. Let's see if that helps. If it doesn't it could make sense to have your gallbladder removed but you would need to see surgeon to know if that is an option. Dr. Brigitte Pulse or I could set that up.  The colonoscopy revealed 2 polyps that I removed and also showed hemorrhoids and diverticulosis. I will let you know pathology results and when to have another routine colonoscopy by mail.   The hemorrhoids might be causing the stool leakage as we discused. I will have my RN contact you from the office to arrange an appointment to start banding the hemorrhoids as we discussed.  I appreciate the opportunity to care for you. Gatha Mayer, MD, FACG     YOU HAD AN ENDOSCOPIC PROCEDURE TODAY AT [facility]:   Refer to the procedure report that was given to you for any specific questions about what was found during the examination.  If the procedure report does not answer your questions, please call your gastroenterologist to clarify.  If you requested that your care partner not be given the details of your procedure findings, then the procedure report has been included in a sealed envelope for you to review at your convenience later.  YOU SHOULD EXPECT: Some feelings of bloating in the abdomen. Passage of more gas than usual.  Walking can help get rid of the air that was put into your GI tract during the procedure and reduce the bloating. If you had a lower endoscopy (such as a colonoscopy or flexible sigmoidoscopy) you may notice spotting of blood in your stool or on the toilet paper. If you underwent a bowel prep for your procedure, you may not have a normal bowel movement for a few days.  Please Note:  You might notice some irritation and  congestion in your nose or some drainage.  This is from the oxygen used during your procedure.  There is no need for concern and it should clear up in a day or so.  SYMPTOMS TO REPORT IMMEDIATELY:   Following lower endoscopy (colonoscopy or flexible sigmoidoscopy):  Excessive amounts of blood in the stool  Significant tenderness or worsening of abdominal pains  Swelling of the abdomen that is new, acute  Fever of 100F or higher   Following upper endoscopy (EGD)  Vomiting of blood or coffee ground material  New chest pain or pain under the shoulder blades  Painful or persistently difficult swallowing  New shortness of breath  Fever of 100F or higher  Black, tarry-looking stools  For urgent or emergent issues, a gastroenterologist can be reached at any hour by calling [phone].   DIET: Your first meal following the procedure should be a small meal and then it is ok to progress to your normal diet. Heavy or fried foods are harder to digest and may make you feel nauseous or bloated.  Likewise, meals heavy in dairy and vegetables can increase bloating.  Drink plenty of fluids but you should avoid alcoholic beverages for 24 hours.  ACTIVITY:  You should plan to take it easy for the rest of today and you should NOT DRIVE or use heavy machinery until tomorrow (because of the sedation medicines used during the test).    FOLLOW  UP: Our staff will call the number listed on your records the next business day following your procedure to check on you and address any questions or concerns that you may have regarding the information given to you following your procedure. If we do not reach you, we will leave a message.  However, if you are feeling well and you are not experiencing any problems, there is no need to return our call.  We will assume that you have returned to your regular daily activities without incident.  If any biopsies were taken you will be contacted by phone or by letter  within the next 1-3 weeks.  Please call us at [phone] if you have not heard about the biopsies in 3 weeks.    SIGNATURES/CONFIDENTIALITY: You and/or your care partner have signed paperwork which will be entered into your electronic medical record.  These signatures attest to the fact that that the information above on your After Visit Summary has been reviewed and is understood.  Full responsibility of the confidentiality of this discharge information lies with you and/or your care-partner.    Resume medications. Information given on polyps,diverticulosis and hemorrhoids with discharge instructions.

## 2014-12-18 ENCOUNTER — Telehealth: Payer: Self-pay | Admitting: *Deleted

## 2014-12-18 NOTE — Telephone Encounter (Signed)
  Follow up Call-  Call back number 12/17/2014  Post procedure Call Back phone  # [phone]  Permission to leave phone message Yes     Patient questions:  Do you have a fever, pain , or abdominal swelling? No. Pain Score  0 *  Have you tolerated food without any problems? Yes.    Have you been able to return to your normal activities? Yes.    Do you have any questions about your discharge instructions: Diet   No. Medications  No. Follow up visit  No.  Do you have questions or concerns about your Care? No.  Actions: * If pain score is 4 or above: No action needed, pain <4.

## 2014-12-30 ENCOUNTER — Encounter: Payer: Self-pay | Admitting: Internal Medicine

## 2014-12-30 DIAGNOSIS — Z860100 Personal history of colon polyps, unspecified: Secondary | ICD-10-CM

## 2014-12-30 HISTORY — DX: Personal history of colon polyps, unspecified: Z86.0100

## 2014-12-30 HISTORY — DX: Personal history of colonic polyps: Z86.010

## 2014-12-30 NOTE — Progress Notes (Signed)
Quick Note:  Hyperplastic ascending and descending polyps 18 mm ascending looked like an ssp - discussed w/ Dr. Tresa Moore and histology hyperplastic Based upon my impression at colonoscopy will recommend repeat colonoscopy 3 years ______

## 2014-12-31 ENCOUNTER — Encounter: Payer: Self-pay | Admitting: Podiatry

## 2014-12-31 ENCOUNTER — Ambulatory Visit (INDEPENDENT_AMBULATORY_CARE_PROVIDER_SITE_OTHER): Payer: Federal, State, Local not specified - PPO | Admitting: Podiatry

## 2014-12-31 VITALS — BP 134/87 | HR 97 | Resp 18

## 2014-12-31 DIAGNOSIS — M722 Plantar fascial fibromatosis: Secondary | ICD-10-CM | POA: Diagnosis not present

## 2014-12-31 MED ORDER — TRIAMCINOLONE ACETONIDE 10 MG/ML IJ SUSP
10.0000 mg | Freq: Once | INTRAMUSCULAR | Status: AC
  Administered 2014-12-31: 10 mg

## 2014-12-31 MED ORDER — METHYLPREDNISOLONE 4 MG PO TBPK: ORAL_TABLET | ORAL | Status: DC

## 2015-01-02 NOTE — Progress Notes (Signed)
Patient ID: Candace Medina, female   DOB: 11-29-1955, 59 y.o.   MRN: [id number]  Subjective: 59 year old female presents the office they for follow-up evaluation of right heel pain, plantar fasciitis. She states that she is somewhat better compared to last appointment of the injection however she discontinued have some pain. She states that she comes complete resolution after the injection however of last week discharge are occur. She continues with icing and stretching activities. Continues to the plantar fascial brace. Denies any numbness or tingling. Denies any swelling or redness. Denies any systemic complaints such as fevers, chills, nausea, vomiting. No acute changes since last appointment, and no other complaints at this time.   Objective: AAO x3, NAD DP/PT pulses palpable bilaterally, CRT less than 3 seconds Protective sensation intact with Simms Weinstein monofilament, vibratory sensation intact, Achilles tendon reflex intact There is mild continued tenderness to palpation overlying the plantar medial tubercle of the calcaneus to right heel at the insertion of the plantar fascia. There is no pain along the course of plantar fascial within the arch of the foot. The plantar fascia appears intact. There is no pain with lateral compression of the calcaneus or pain the vibratory sensation. No pain on the posterior aspect of the calcaneus or along the course/insertion of the Achilles tendon. There is no overlying edema, erythema, increase in warmth. No other areas of tenderness palpation or pain with vibratory sensation to the foot/ankle. MMT 5/5, ROM WNL No open lesions or pre-ulcerative lesions are identified. No pain with calf compression, swelling, warmth, erythema.   Assessment: 59 year old female with continued right heel pain, plantar fasciitis  Plan: -All treatment options discussed with the patient including all alternatives, risks, complications.  -Patient elects to proceed with  steroid injection into the right heel. Under sterile skin preparation, a total of 2.5cc of kenalog 10, 0.5% Marcaine plain, and 2% lidocaine plain were infiltrated into the symptomatic area without complication. A band-aid was applied. Patient tolerated the injection well without complication. Post-injection care with discussed with the patient. Discussed with the patient to ice the area over the next couple of days to help prevent a steroid flare.  -Plantar fascial taping was applied. Once the tape was removed and continue the plantar fascial brace. -Continue stretching activities -Ice to the area -Discussed shoe gear modifications and possible orthotics. We'll likely consider new shoes/orthotics at next appointment. -Follow-up in 3 weeks -Patient encouraged to call the office with any questions, concerns, change in symptoms.

## 2015-01-22 ENCOUNTER — Ambulatory Visit (INDEPENDENT_AMBULATORY_CARE_PROVIDER_SITE_OTHER): Payer: Federal, State, Local not specified - PPO | Admitting: Internal Medicine

## 2015-01-22 ENCOUNTER — Encounter: Payer: Self-pay | Admitting: Internal Medicine

## 2015-01-22 VITALS — BP 110/78 | HR 96 | Ht 61.75 in | Wt 180.5 lb

## 2015-01-22 DIAGNOSIS — R151 Fecal smearing: Secondary | ICD-10-CM

## 2015-01-22 DIAGNOSIS — K645 Perianal venous thrombosis: Secondary | ICD-10-CM | POA: Insufficient documentation

## 2015-01-22 DIAGNOSIS — K648 Other hemorrhoids: Secondary | ICD-10-CM | POA: Diagnosis not present

## 2015-01-22 NOTE — Patient Instructions (Signed)
HEMORRHOID BANDING PROCEDURE    FOLLOW-UP CARE   1. The procedure you have had should have been relatively painless since the banding of the area involved does not have nerve endings and there is no pain sensation.  The rubber band cuts off the blood supply to the hemorrhoid and the band may fall off as soon as 48 hours after the banding (the band may occasionally be seen in the toilet bowl following a bowel movement). You may notice a temporary feeling of fullness in the rectum which should respond adequately to plain Tylenol or Motrin.  2. Following the banding, avoid strenuous exercise that evening and resume full activity the next day.  A sitz bath (soaking in a warm tub) or bidet is soothing, and can be useful for cleansing the area after bowel movements.     3. To avoid constipation, take two tablespoons of natural wheat bran, natural oat bran, flax, Benefiber or any over the counter fiber supplement and increase your water intake to 7-8 glasses daily.    4. Unless you have been prescribed anorectal medication, do not put anything inside your rectum for two weeks: No suppositories, enemas, fingers, etc.  5. Occasionally, you may have more bleeding than usual after the banding procedure.  This is often from the untreated hemorrhoids rather than the treated one.  Don't be concerned if there is a tablespoon or so of blood.  If there is more blood than this, lie flat with your bottom higher than your head and apply an ice pack to the area. If the bleeding does not stop within a half an hour or if you feel faint, call our office at [phone] or go to the emergency room.  6. Problems are not common; however, if there is a substantial amount of bleeding, severe pain, chills, fever or difficulty passing urine (very rare) or other problems, you should call us at [phone] or report to the nearest emergency room.  7. Do not stay seated continuously for more than 2-3 hours for a day or two  after the procedure.  Tighten your buttock muscles 10-15 times every two hours and take 10-15 deep breaths every 1-2 hours.  Do not spend more than a few minutes on the toilet if you cannot empty your bowel; instead re-visit the toilet at a later time.    Please use benefiber 1-2 tablespoons daily, handout provided.   If you have questions regarding your bowel movements please call us.   We will see you at your next appointment June 23 at 2:15PM    I appreciate the opportunity to care for you.

## 2015-01-22 NOTE — Progress Notes (Signed)
Patient ID: Candace Medina, female   DOB: [birth year], 59 y.o.   MRN: [id number]   4 BM's/week Some alternating habits Occasional prolonged stooling ? Prolapse + anal itching + anal burning + fecal soilinh   Rectal:  Female staff present - small nontender thrombosed LL external hemorrhoid and RP and RA tags No mass   Anoscopy was performed with the patient in the left lateral decubitus position while a chaperone was present and revealed Gr 2 internal hemorrhoids all 3 positions  PROCEDURE NOTE: The patient presents with symptomatic grade 2  hemorrhoids, requesting rubber band ligation of his/her hemorrhoidal disease.  All risks, benefits and alternative forms of therapy were described and informed consent was obtained.   The anorectum was pre-medicated with 0.125% NTG and 5% lidocaine The decision was made to band the RA internal hemorrhoid, and the [place] was used to perform band ligation without complication.  Digital anorectal examination was then performed to assure proper positioning of the band, and to adjust the banded tissue as required.  The patient was discharged home without pain or other issues.  Dietary and behavioral recommendations were given and along with follow-up instructions.     The following adjunctive treatments were recommended:  Benefiber prn - she has alternating habits About to start high fiber weight loss diet  The patient will return in June for  follow-up and possible additional banding as required. No complications were encountered and the patient tolerated the procedure well.  I appreciate the opportunity to care for her CX:KGYJ, Gwyndolyn Saxon, MD

## 2015-01-22 NOTE — Assessment & Plan Note (Signed)
Treat hemorrhoids to help

## 2015-01-22 NOTE — Assessment & Plan Note (Signed)
RA banded , use Benefiber prn Repeat banding in June

## 2015-01-23 ENCOUNTER — Ambulatory Visit (INDEPENDENT_AMBULATORY_CARE_PROVIDER_SITE_OTHER): Payer: Federal, State, Local not specified - PPO | Admitting: Podiatry

## 2015-01-23 VITALS — BP 131/89 | HR 88 | Resp 16

## 2015-01-23 DIAGNOSIS — M722 Plantar fascial fibromatosis: Secondary | ICD-10-CM | POA: Diagnosis not present

## 2015-01-23 DIAGNOSIS — M459 Ankylosing spondylitis of unspecified sites in spine: Secondary | ICD-10-CM

## 2015-01-26 NOTE — Progress Notes (Signed)
Patient ID: TNYA ADES, female   DOB: [birth year], 59 y.o.   MRN: [id number]  Subjective: Ms. Milberger presents the office they for follow-up evaluation of right heel pain, plantar fasciitis. She states that she continues to be much improved compared to what she was initially and even compared to last appointment. She has continued with icing, stretching, shoe gear and ramifications, night splint. She also states that she had no combination some the injection. She states that she is going significantly better than what she was previously although she does continue to get some mild intermittent discomfort to the bottom of the heel. Denies any systemic complaints such as fevers, chills, nausea, vomiting. No acute changes since last appointment, and no other complaints at this time.   Objective: AAO x3, NAD DP/PT pulses palpable bilaterally, CRT less than 3 seconds Protective sensation intact with Simms Weinstein monofilament, vibratory sensation intact, Achilles tendon reflex intact, negative tinel sign There is mild continued tenderness to palpation overlying the plantar medial tubercle of the calcaneus to right heel at the insertion of the plantar fascia, although it does continue to be improving. There is no pain along the course of plantar fascia within the arch of the foot. The plantar fascia appears intact. There is no pain with lateral compression of the calcaneus or pain the vibratory sensation. No pain on the posterior aspect of the calcaneus or along the course/insertion of the Achilles tendon. There is no overlying edema, erythema, increase in warmth. No other areas of tenderness palpation or pain with vibratory sensation to the foot/ankle biltearlly. MMT 5/5, ROM WNL No open lesions or pre-ulcerative lesions are identified. No pain with calf compression, swelling, warmth, erythema.  Assessment: 59 year old female with resolving right heel pain, plantar fasciitis  Plan: -All treatment  options discussed with the patient including all alternatives, risks, complications.  -Patient elects to proceed with steroid injection into the right heel. Under sterile skin preparation, a total of 2.5cc of kenalog 10, 0.5% Marcaine plain, and 2% lidocaine plain were infiltrated into the symptomatic area without complication. A band-aid was applied. Patient tolerated the injection well without complication. Post-injection care with discussed with the patient. Discussed with the patient to ice the area over the next couple of days to help prevent a steroid flare.  -Plantar fascial taping was applied. Once the tape was removed and continue the plantar fascial brace. -Continue stretching activities -Ice to the area -Rx for new inserts/shoes was given to the patient for [employer].  -Follow-up in 3 weeks -Patient encouraged to call the office with any questions, concerns, change in symptoms.

## 2015-02-11 HISTORY — PX: HEMORRHOID BANDING: SHX5850

## 2015-03-05 ENCOUNTER — Encounter: Payer: Self-pay | Admitting: Internal Medicine

## 2015-03-05 ENCOUNTER — Ambulatory Visit (INDEPENDENT_AMBULATORY_CARE_PROVIDER_SITE_OTHER): Payer: Federal, State, Local not specified - PPO | Admitting: Internal Medicine

## 2015-03-05 VITALS — BP 110/70 | HR 84 | Ht 61.75 in | Wt 172.2 lb

## 2015-03-05 DIAGNOSIS — R151 Fecal smearing: Secondary | ICD-10-CM

## 2015-03-05 DIAGNOSIS — K648 Other hemorrhoids: Secondary | ICD-10-CM

## 2015-03-05 DIAGNOSIS — R1011 Right upper quadrant pain: Secondary | ICD-10-CM

## 2015-03-05 NOTE — Progress Notes (Signed)
   Less problems with soiling after RA banding Also c/o RUQ and infrascapular pain after eating but in between meals also    PROCEDURE NOTE: The patient presents with symptomatic grade 2  hemorrhoids, requesting rubber band ligation of his/her hemorrhoidal disease.  All risks, benefits and alternative forms of therapy were described and informed consent was obtained.   The anorectum was pre-medicated with NTG and lidocaine The decision was made to band the RP and LL internal hemorrhoid, and the [place] was used to perform band ligation without complication.  Digital anorectal examination was then performed to assure proper positioning of the band, and to adjust the banded tissue as required.  The patient was discharged home without pain or other issues.  Dietary and behavioral recommendations were given and along with follow-up instructions.     The following adjunctive treatments were recommended:  Benefiber pn  The patient will return  prn for  follow-up and possible additional banding as required. No complications were encountered and the patient tolerated the procedure well.  She will be referred to [facility] to evaluate RUQ pain that sounds biliary in origin  PH:XTAV, Gwyndolyn Saxon, MD

## 2015-03-05 NOTE — Assessment & Plan Note (Signed)
RP and LL banded 

## 2015-03-05 NOTE — Assessment & Plan Note (Signed)
Improved after 1 band RP and LL banded today

## 2015-03-05 NOTE — Patient Instructions (Addendum)
HEMORRHOID BANDING PROCEDURE    FOLLOW-UP CARE   1. The procedure you have had should have been relatively painless since the banding of the area involved does not have nerve endings and there is no pain sensation.  The rubber band cuts off the blood supply to the hemorrhoid and the band may fall off as soon as 48 hours after the banding (the band may occasionally be seen in the toilet bowl following a bowel movement). You may notice a temporary feeling of fullness in the rectum which should respond adequately to plain Tylenol or Motrin.  2. Following the banding, avoid strenuous exercise that evening and resume full activity the next day.  A sitz bath (soaking in a warm tub) or bidet is soothing, and can be useful for cleansing the area after bowel movements.     3. To avoid constipation, take two tablespoons of natural wheat bran, natural oat bran, flax, Benefiber or any over the counter fiber supplement and increase your water intake to 7-8 glasses daily.    4. Unless you have been prescribed anorectal medication, do not put anything inside your rectum for two weeks: No suppositories, enemas, fingers, etc.  5. Occasionally, you may have more bleeding than usual after the banding procedure.  This is often from the untreated hemorrhoids rather than the treated one.  Don't be concerned if there is a tablespoon or so of blood.  If there is more blood than this, lie flat with your bottom higher than your head and apply an ice pack to the area. If the bleeding does not stop within a half an hour or if you feel faint, call our office at [phone] or go to the emergency room.  6. Problems are not common; however, if there is a substantial amount of bleeding, severe pain, chills, fever or difficulty passing urine (very rare) or other problems, you should call us at [phone] or report to the nearest emergency room.  7. Do not stay seated continuously for more than 2-3 hours for a day or two  after the procedure.  Tighten your buttock muscles 10-15 times every two hours and take 10-15 deep breaths every 1-2 hours.  Do not spend more than a few minutes on the toilet if you cannot empty your bowel; instead re-visit the toilet at a later time.    Follow up with Dr Carlean Purl in 2 months if needed.    You have been scheduled for an appointment with Dr. Verita Lamb at [facility]. Your appointment is on 03/23/15 at 10:00AM. Please arrive at 9:30AM for registration. Make certain to bring a list of current medications, including any over the counter medications or vitamins. Also bring your co-pay if you have one as well as your insurance cards. [facility] is located at [address]. Should you need to reschedule your appointment, please contact them at [phone].   I appreciate the opportunity to care for you. Silvano Rusk, MD, [facility]

## 2015-03-05 NOTE — Assessment & Plan Note (Signed)
Refer to [facility] -seems like GB - had pain with CCK on HIDA 2015

## 2015-03-23 ENCOUNTER — Ambulatory Visit: Payer: Self-pay | Admitting: Surgery

## 2015-03-23 NOTE — H&P (Signed)
History of Present Illness Candace Medina. Chelsea Nusz MD; 03/23/2015 10:38 AM) Patient words: gallbladder.  The patient is a 59 year old female who presents with abdominal pain. Referred by Dr. Carlean Purl for evaluation of gallbladder  This is a 59 year old female with a complicated past medical history including ankylosing spondylitis, irritable bowel syndrome, who presents with at least a year and a half of intermittent right upper quadrant abdominal pain that is now radiating through to her back. This has become more frequent and now occurs a couple times a day. Sometimes this is associated with eating but sometimes she gets the pain without eating. She denies any significant nausea or vomiting. Patient has very irregular bowel movements and alternates between constipation and diarrhea. She is on a special low-fat diet.  2015 she had a workup by Dr. Brigitte Pulse for this right upper quadrant pain. Ultrasound and HIDA scan were read as normal. However she had significant exacerbation of her right upper quadrant pain with the CCK administration. Based on these findings and her continued pain, Dr. Carlean Purl asked her to come see [place] about possible elective cholecystectomy.   CLINICAL DATA: Right upper quadrant pain. EXAM: US ABDOMEN LIMITED - RIGHT UPPER QUADRANT COMPARISON: None. FINDINGS: Gallbladder No gallstones or wall thickening visualized. No sonographic Murphy sign noted. Common bile duct Diameter: 1.9 mm Liver: No focal lesion identified. Suggestion of mild fatty infiltration. IMPRESSION: No acute hepatobiliary findings. Suggestion mild fatty infiltration of the liver. Electronically Signed By: Marin Olp M.D. On: 07/15/2013 08:14      CLINICAL DATA: Right upper quadrant pain  EXAM: NUCLEAR MEDICINE HEPATOBILIARY IMAGING WITH GALLBLADDER EF  TECHNIQUE: Sequential images of the abdomen were obtained out to 60 minutes following intravenous administration of  radiopharmaceutical. After slow intravenous infusion of 1.6 micrograms Cholecystokinin, gallbladder ejection fraction was determined.  RADIOPHARMACEUTICALS: 4.9 [place] DD-22G Choletec  COMPARISON: None.  FINDINGS: Uptake within the intrahepatic bile ducts and gallbladder is visualized at 10 min. Uptake within small bowel is evident by 15 min. At 30 min, normal ejection fraction is greater than 30%.  The patient did experience symptoms during CCK infusion.  IMPRESSION: 1. Normal visualization of the gallbladder consistent with patency of the cystic duct. 2. The gallbladder ejection fraction is greater than 30%. 3. The patient did experience pain during CCK infusion.   Electronically Signed By: Jacqulynn Cadet M.D. On: 02/07/2014 11:52 Other Problems (Sonya Bynum, CMA; 03/23/2015 9:43 AM) Arthritis Back Pain Chronic Obstructive Lung Disease Gastroesophageal Reflux Disease General anesthesia - complications Hemorrhoids Migraine Headache  Past Surgical History Marjean Donna, CMA; 03/23/2015 9:43 AM) Colon Polyp Removal - Colonoscopy Hysterectomy (not due to cancer) - Partial Shoulder Surgery Bilateral. Tonsillectomy  Diagnostic Studies History Marjean Donna, CMA; 03/23/2015 9:43 AM) Colonoscopy within last year Mammogram 1-3 years ago Pap Smear >5 years ago  Allergies Davy Pique Bynum, CMA; 03/23/2015 9:45 AM) Codeine Phosphate *ANALGESICS - OPIOID* Keflex *CEPHALOSPORINS* Morphine Sulfate (Concentrate) *ANALGESICS - OPIOID* Boniva *ENDOCRINE AND METABOLIC AGENTS - MISC.* Venomil Honey Bee Venom *BIOLOGICALS MISC*  Medication History (Sonya Bynum, CMA; 03/23/2015 9:46 AM) TraMADol HCl (50MG  Tablet, Oral) Active. Enbrel SureClick (50MG /ML Soln Auto-inj, Subcutaneous) Active. Etodolac (500MG  Tablet, Oral) Active. Fluticasone Propionate (50MCG/ACT Suspension, Nasal) Active. Omeprazole (20MG  Capsule DR, Oral) Active. Symbicort (80-4.5MCG/ACT  Aerosol, Inhalation) Active. Ventolin HFA (108 (90 Base)MCG/ACT Aerosol Soln, Inhalation) Active. Vitamin D (Ergocalciferol) (50000UNIT Capsule, Oral) Active. Clobetasol Propionate (0.05% Cream, External as needed) Active. Medications Reconciled  Social History Marjean Donna, CMA; 03/23/2015 9:43 AM) Alcohol use Occasional alcohol use. Caffeine  use Coffee. No drug use Tobacco use Current every day smoker.  Family History Marjean Donna, [place]; 03/23/2015 9:43 AM) Alcohol Abuse Brother, Mother. Arthritis Father. Cancer Father. Diabetes Mellitus Father. Melanoma Brother. Respiratory Condition Mother.  Pregnancy / Birth History Marjean Donna, [place]; 03/23/2015 9:43 AM) Age at menarche 34 years. Age of menopause <46 Gravida 2 Maternal age 58-25 Para 2     Review of Systems ([place]; 03/23/2015 9:43 AM) General Present- Weight Loss. Not Present- Appetite Loss, Chills, Fatigue, Fever, Night Sweats and Weight Gain. Skin Not Present- Change in Wart/Mole, Dryness, Hives, Jaundice, New Lesions, Non-Healing Wounds, Rash and Ulcer. HEENT Present- Wears glasses/contact lenses. Not Present- Earache, Hearing Loss, Hoarseness, Nose Bleed, Oral Ulcers, Ringing in the Ears, Seasonal Allergies, Sinus Pain, Sore Throat, Visual Disturbances and Yellow Eyes. Respiratory Present- Snoring. Not Present- Bloody sputum, Chronic Cough, Difficulty Breathing and Wheezing. Breast Not Present- Breast Mass, Breast Pain, Nipple Discharge and Skin Changes. Cardiovascular Not Present- Chest Pain, Difficulty Breathing Lying Down, Leg Cramps, Palpitations, Rapid Heart Rate, Shortness of Breath and Swelling of Extremities. Gastrointestinal Present- Hemorrhoids. Not Present- Abdominal Pain, Bloating, Bloody Stool, Change in Bowel Habits, Chronic diarrhea, Constipation, Difficulty Swallowing, Excessive gas, Gets full quickly at meals, Indigestion, Nausea, Rectal Pain and Vomiting. Female Genitourinary  Not Present- Frequency, Nocturia, Painful Urination, Pelvic Pain and Urgency. Musculoskeletal Present- Back Pain, Joint Pain and Joint Stiffness. Not Present- Muscle Pain, Muscle Weakness and Swelling of Extremities. Neurological Not Present- Decreased Memory, Fainting, Headaches, Numbness, Seizures, Tingling, Tremor, Trouble walking and Weakness. Psychiatric Not Present- Anxiety, Bipolar, Change in Sleep Pattern, Depression, Fearful and Frequent crying. Endocrine Not Present- Cold Intolerance, Excessive Hunger, Hair Changes, Heat Intolerance, Hot flashes and New Diabetes. Hematology Not Present- Easy Bruising, Excessive bleeding, Gland problems, HIV and Persistent Infections.  Vitals (Sonya Bynum CMA; 03/23/2015 9:44 AM) 03/23/2015 9:44 AM Weight: 170 lb Height: 62in Body Surface Area: 1.84 m Body Mass Index: 31.09 kg/m Temp.: 50F(Temporal)  Pulse: 79 (Regular)  BP: 130/70 (Sitting, Left Arm, Standard)     Physical Exam Rodman Key K. Genny Caulder MD; 03/23/2015 10:36 AM)  The physical exam findings are as follows: Note:WDWN in NAD HEENT: EOMI, sclera anicteric Neck: No masses, no thyromegaly Lungs: CTA bilaterally; normal respiratory effort CV: Regular rate and rhythm; no murmurs Abd: +bowel sounds, soft, mild RUQ tenderness; no masses Ext: Well-perfused; no edema Skin: Warm, dry; no sign of jaundice    Assessment & Plan Rodman Key K. Brentley Landfair MD; 03/23/2015 10:37 AM)  CHRONIC CHOLECYSTITIS (575.11  K81.1)  Current Plans Schedule for Surgery - Laparoscopic cholecystectomy with intraoperative cholangiogram. The surgical procedure has been discussed with the patient. Potential risks, benefits, alternative treatments, and expected outcomes have been explained. All of the patient's questions at this time have been answered. The likelihood of reaching the patient's treatment goal is good. The patient understand the proposed surgical procedure and wishes to proceed. Note:Although  there are no studies that clearly show stones or cystic duct obstruction, her symptomatology and the exacerbation by CCK point towards the gallbladder as the source of her pain. Her symptoms have progressively point that she is willing to undergo elective cholecystectomy with the understanding that there is no guarantee that this will resolve her symptoms. She has had a negative EGD and colonoscopy. At this point, I would offer her elective laparoscopic cholecystectomy.   Candace Medina. Georgette Dover, MD, [facility]  General/ Trauma Surgery  03/23/2015 10:38 AM

## 2015-05-01 ENCOUNTER — Ambulatory Visit (INDEPENDENT_AMBULATORY_CARE_PROVIDER_SITE_OTHER): Payer: Federal, State, Local not specified - PPO | Admitting: Podiatry

## 2015-05-01 ENCOUNTER — Encounter: Payer: Self-pay | Admitting: Podiatry

## 2015-05-01 VITALS — BP 107/67 | HR 112 | Resp 18

## 2015-05-01 DIAGNOSIS — B078 Other viral warts: Secondary | ICD-10-CM

## 2015-05-01 DIAGNOSIS — B070 Plantar wart: Secondary | ICD-10-CM | POA: Diagnosis not present

## 2015-05-01 DIAGNOSIS — M722 Plantar fascial fibromatosis: Secondary | ICD-10-CM | POA: Diagnosis not present

## 2015-05-01 DIAGNOSIS — M459 Ankylosing spondylitis of unspecified sites in spine: Secondary | ICD-10-CM | POA: Diagnosis not present

## 2015-05-01 DIAGNOSIS — B079 Viral wart, unspecified: Secondary | ICD-10-CM

## 2015-05-07 NOTE — Progress Notes (Signed)
Patient ID: Candace Medina, female   DOB: [birth year], 59 y.o.   MRN: [id number]  Subjective: 59 year old female presents the office today for follow-up evaluation of continued heel pain to her right foot. She said that she continues have pain and when she first gets up or after standing for long periods of time. She also has pain when standing at work. She denies any numbness or tingling. Describes as a throbbing pain. No recent injury or trauma. No swelling or redness. She also states that she continues to get a lesion of the tip of her right third toe on the bottom which continues to recur. Denies any swelling or redness or drainage from the area. No other complaints at this time.  Objective: AAO x3, NAD DP/PT pulses palpable bilaterally, CRT less than 3 seconds Protective sensation intact with Simms Weinstein monofilament, vibratory sensation intact, Achilles tendon reflex intact There is tenderness to palpation overlying the plantar medial tubercle of the calcaneus to the right heel at the insertion of the plantar fascia. There is no pain along the course of plantar fascia within the arch of the foot and the plantar fascia appears intact. There is no pain with lateral compression of the calcaneus or pain the vibratory sensation. No pain on the posterior aspect of the calcaneus or along the course/insertion of the Achilles tendon.  At the distal aspect of the right third digit on the plantar aspect is granular hyperkeratotic lesion. There is a small amount of pinpoint bleeding upon debridement. There is no surrounding erythema, drainage or other signs of infection. There is no overlying edema, erythema, increase in warmth. No other areas of tenderness palpation or pain with vibratory sensation to the foot/ankle. MMT 5/5, ROM WNL No open lesions or pre-ulcerative lesions are identified. No pain with calf compression, swelling, warmth, erythema.  Assessment: 59 year old female with continued right  heel pain, plantar fasciitis; likely verruca right third toe  Plan: -Treatment options discussed including all alternatives, risks, and complications -She is attended multiple injections and treatment to the right heel pain continues to recur. Also discussed with her given her ankylosing spondylitis is also contributes her heel pain. -After discussion about further treatment she has elected to proceed with immobilization in a cam boot. This was dispensed to her today. Wear this all the time even in light. -Continue stretching icing activities. -Continue anti-inflammatories. -Lesion of the right third toe is sharply debrided without complication and there was evidence of verruca. The area was cleaned and canthrone was applied Followed by an Occlusive Bandage. Post Procedure Instructions Were Discussed the Patient. Monitor for any clinical signs or symptoms of infection and directed to call the office immediately should any occur or go to the ER. -Follow-up 4 weeks or sooner if any problems arise. In the meantime, encouraged to call the office with any questions, concerns, change in symptoms.   Celesta Gentile, DPM

## 2015-05-15 ENCOUNTER — Telehealth: Payer: Self-pay | Admitting: *Deleted

## 2015-05-15 NOTE — Telephone Encounter (Signed)
Faxed Dr. Jacqualyn Posey signed requested forms for diabetic shoes and custom molded inserts with chart notes of 05/01/2015/

## 2015-05-29 ENCOUNTER — Ambulatory Visit: Payer: Federal, State, Local not specified - PPO | Admitting: Podiatry

## 2015-08-28 ENCOUNTER — Encounter: Payer: Self-pay | Admitting: Acute Care

## 2015-08-31 ENCOUNTER — Other Ambulatory Visit: Payer: Self-pay | Admitting: Acute Care

## 2015-08-31 ENCOUNTER — Encounter: Payer: Federal, State, Local not specified - PPO | Admitting: Acute Care

## 2015-08-31 ENCOUNTER — Ambulatory Visit
Admission: RE | Admit: 2015-08-31 | Discharge: 2015-08-31 | Disposition: A | Discharge status: Home / Self Care | Payer: Federal, State, Local not specified - PPO | Source: Ambulatory Visit | Attending: Acute Care | Admitting: Acute Care

## 2015-08-31 ENCOUNTER — Telehealth: Payer: Self-pay | Admitting: Acute Care

## 2015-08-31 DIAGNOSIS — F17210 Nicotine dependence, cigarettes, uncomplicated: Secondary | ICD-10-CM

## 2015-08-31 NOTE — Telephone Encounter (Signed)
Left message for patient letting her know she does not need to come and see me before her scan due to the fact Dr. Brigitte Pulse did the Shared Decision Making visit with the patient. I confirmed that she just needs to go for the scan, which is scheduled at [facility] at 3:30. I have asked she return my call to clarify this and to make sure she understands the change.I left my contact information.

## 2015-12-02 ENCOUNTER — Other Ambulatory Visit: Payer: Self-pay | Admitting: Acute Care

## 2015-12-02 DIAGNOSIS — F17210 Nicotine dependence, cigarettes, uncomplicated: Secondary | ICD-10-CM

## 2015-12-11 ENCOUNTER — Ambulatory Visit (HOSPITAL_COMMUNITY)
Admission: RE | Admit: 2015-12-11 | Discharge: 2015-12-11 | Disposition: A | Discharge status: Home / Self Care | Payer: Federal, State, Local not specified - PPO | Source: Ambulatory Visit | Attending: Vascular Surgery | Admitting: Vascular Surgery

## 2015-12-11 ENCOUNTER — Other Ambulatory Visit (HOSPITAL_COMMUNITY): Payer: Self-pay | Admitting: Internal Medicine

## 2015-12-11 DIAGNOSIS — J449 Chronic obstructive pulmonary disease, unspecified: Secondary | ICD-10-CM | POA: Diagnosis not present

## 2015-12-11 DIAGNOSIS — F329 Major depressive disorder, single episode, unspecified: Secondary | ICD-10-CM | POA: Insufficient documentation

## 2015-12-11 DIAGNOSIS — M79606 Pain in leg, unspecified: Secondary | ICD-10-CM | POA: Diagnosis not present

## 2015-12-11 DIAGNOSIS — E785 Hyperlipidemia, unspecified: Secondary | ICD-10-CM | POA: Diagnosis not present

## 2015-12-11 DIAGNOSIS — F419 Anxiety disorder, unspecified: Secondary | ICD-10-CM | POA: Insufficient documentation

## 2015-12-11 DIAGNOSIS — Z720 Tobacco use: Secondary | ICD-10-CM | POA: Diagnosis not present

## 2016-01-12 DIAGNOSIS — M4807 Spinal stenosis, lumbosacral region: Secondary | ICD-10-CM | POA: Diagnosis not present

## 2016-01-12 DIAGNOSIS — M5136 Other intervertebral disc degeneration, lumbar region: Secondary | ICD-10-CM | POA: Diagnosis not present

## 2016-01-23 DIAGNOSIS — M4807 Spinal stenosis, lumbosacral region: Secondary | ICD-10-CM | POA: Diagnosis not present

## 2016-02-04 DIAGNOSIS — Z79899 Other long term (current) drug therapy: Secondary | ICD-10-CM | POA: Diagnosis not present

## 2016-02-04 DIAGNOSIS — M450 Ankylosing spondylitis of multiple sites in spine: Secondary | ICD-10-CM | POA: Diagnosis not present

## 2016-02-04 DIAGNOSIS — M545 Low back pain: Secondary | ICD-10-CM | POA: Diagnosis not present

## 2016-02-04 DIAGNOSIS — M150 Primary generalized (osteo)arthritis: Secondary | ICD-10-CM | POA: Diagnosis not present

## 2016-02-09 DIAGNOSIS — M5136 Other intervertebral disc degeneration, lumbar region: Secondary | ICD-10-CM | POA: Diagnosis not present

## 2016-02-26 DIAGNOSIS — Z1231 Encounter for screening mammogram for malignant neoplasm of breast: Secondary | ICD-10-CM | POA: Diagnosis not present

## 2016-03-01 DIAGNOSIS — M5136 Other intervertebral disc degeneration, lumbar region: Secondary | ICD-10-CM | POA: Diagnosis not present

## 2016-03-08 DIAGNOSIS — N6002 Solitary cyst of left breast: Secondary | ICD-10-CM | POA: Diagnosis not present

## 2016-03-08 DIAGNOSIS — N6001 Solitary cyst of right breast: Secondary | ICD-10-CM | POA: Diagnosis not present

## 2016-03-14 DIAGNOSIS — R300 Dysuria: Secondary | ICD-10-CM | POA: Diagnosis not present

## 2016-03-16 DIAGNOSIS — Z6832 Body mass index (BMI) 32.0-32.9, adult: Secondary | ICD-10-CM | POA: Diagnosis not present

## 2016-03-16 DIAGNOSIS — R300 Dysuria: Secondary | ICD-10-CM | POA: Diagnosis not present

## 2016-03-16 DIAGNOSIS — R1011 Right upper quadrant pain: Secondary | ICD-10-CM | POA: Diagnosis not present

## 2016-03-16 DIAGNOSIS — M459 Ankylosing spondylitis of unspecified sites in spine: Secondary | ICD-10-CM | POA: Diagnosis not present

## 2016-03-23 DIAGNOSIS — M545 Low back pain: Secondary | ICD-10-CM | POA: Diagnosis not present

## 2016-03-23 DIAGNOSIS — M450 Ankylosing spondylitis of multiple sites in spine: Secondary | ICD-10-CM | POA: Diagnosis not present

## 2016-03-23 DIAGNOSIS — M150 Primary generalized (osteo)arthritis: Secondary | ICD-10-CM | POA: Diagnosis not present

## 2016-03-23 DIAGNOSIS — Z79899 Other long term (current) drug therapy: Secondary | ICD-10-CM | POA: Diagnosis not present

## 2016-04-22 DIAGNOSIS — Z6833 Body mass index (BMI) 33.0-33.9, adult: Secondary | ICD-10-CM | POA: Diagnosis not present

## 2016-04-22 DIAGNOSIS — M5380 Other specified dorsopathies, site unspecified: Secondary | ICD-10-CM | POA: Diagnosis not present

## 2016-04-22 DIAGNOSIS — M797 Fibromyalgia: Secondary | ICD-10-CM | POA: Diagnosis not present

## 2016-04-22 DIAGNOSIS — R1011 Right upper quadrant pain: Secondary | ICD-10-CM | POA: Diagnosis not present

## 2016-05-10 DIAGNOSIS — M5136 Other intervertebral disc degeneration, lumbar region: Secondary | ICD-10-CM | POA: Diagnosis not present

## 2016-05-13 DIAGNOSIS — K760 Fatty (change of) liver, not elsewhere classified: Secondary | ICD-10-CM | POA: Diagnosis not present

## 2016-05-13 DIAGNOSIS — R1011 Right upper quadrant pain: Secondary | ICD-10-CM | POA: Diagnosis not present

## 2016-05-24 DIAGNOSIS — F458 Other somatoform disorders: Secondary | ICD-10-CM | POA: Diagnosis not present

## 2016-05-24 DIAGNOSIS — R61 Generalized hyperhidrosis: Secondary | ICD-10-CM | POA: Diagnosis not present

## 2016-05-24 DIAGNOSIS — R05 Cough: Secondary | ICD-10-CM | POA: Diagnosis not present

## 2016-05-24 DIAGNOSIS — M797 Fibromyalgia: Secondary | ICD-10-CM | POA: Diagnosis not present

## 2016-06-10 DIAGNOSIS — M797 Fibromyalgia: Secondary | ICD-10-CM | POA: Diagnosis not present

## 2016-06-10 DIAGNOSIS — R61 Generalized hyperhidrosis: Secondary | ICD-10-CM | POA: Diagnosis not present

## 2016-06-10 DIAGNOSIS — R490 Dysphonia: Secondary | ICD-10-CM | POA: Diagnosis not present

## 2016-06-10 DIAGNOSIS — F458 Other somatoform disorders: Secondary | ICD-10-CM | POA: Diagnosis not present

## 2016-07-11 DIAGNOSIS — K219 Gastro-esophageal reflux disease without esophagitis: Secondary | ICD-10-CM | POA: Diagnosis not present

## 2016-07-11 DIAGNOSIS — R490 Dysphonia: Secondary | ICD-10-CM | POA: Diagnosis not present

## 2016-07-11 DIAGNOSIS — J342 Deviated nasal septum: Secondary | ICD-10-CM | POA: Diagnosis not present

## 2016-07-11 DIAGNOSIS — J343 Hypertrophy of nasal turbinates: Secondary | ICD-10-CM | POA: Diagnosis not present

## 2016-07-27 DIAGNOSIS — E784 Other hyperlipidemia: Secondary | ICD-10-CM | POA: Diagnosis not present

## 2016-07-27 DIAGNOSIS — M450 Ankylosing spondylitis of multiple sites in spine: Secondary | ICD-10-CM | POA: Diagnosis not present

## 2016-07-27 DIAGNOSIS — M859 Disorder of bone density and structure, unspecified: Secondary | ICD-10-CM | POA: Diagnosis not present

## 2016-07-27 DIAGNOSIS — Z0000 Encounter for general adult medical examination without abnormal findings: Secondary | ICD-10-CM | POA: Diagnosis not present

## 2016-07-27 DIAGNOSIS — M150 Primary generalized (osteo)arthritis: Secondary | ICD-10-CM | POA: Diagnosis not present

## 2016-07-27 DIAGNOSIS — M545 Low back pain: Secondary | ICD-10-CM | POA: Diagnosis not present

## 2016-07-27 DIAGNOSIS — R7301 Impaired fasting glucose: Secondary | ICD-10-CM | POA: Diagnosis not present

## 2016-07-27 DIAGNOSIS — Z79899 Other long term (current) drug therapy: Secondary | ICD-10-CM | POA: Diagnosis not present

## 2016-07-27 DIAGNOSIS — R5383 Other fatigue: Secondary | ICD-10-CM | POA: Diagnosis not present

## 2016-08-02 DIAGNOSIS — H04123 Dry eye syndrome of bilateral lacrimal glands: Secondary | ICD-10-CM | POA: Diagnosis not present

## 2016-08-03 DIAGNOSIS — M25511 Pain in right shoulder: Secondary | ICD-10-CM | POA: Diagnosis not present

## 2016-08-03 DIAGNOSIS — Z1389 Encounter for screening for other disorder: Secondary | ICD-10-CM | POA: Diagnosis not present

## 2016-08-03 DIAGNOSIS — Z23 Encounter for immunization: Secondary | ICD-10-CM | POA: Diagnosis not present

## 2016-08-03 DIAGNOSIS — E119 Type 2 diabetes mellitus without complications: Secondary | ICD-10-CM | POA: Diagnosis not present

## 2016-08-03 DIAGNOSIS — J449 Chronic obstructive pulmonary disease, unspecified: Secondary | ICD-10-CM | POA: Diagnosis not present

## 2016-08-03 DIAGNOSIS — Z0000 Encounter for general adult medical examination without abnormal findings: Secondary | ICD-10-CM | POA: Diagnosis not present

## 2016-08-03 DIAGNOSIS — E784 Other hyperlipidemia: Secondary | ICD-10-CM | POA: Diagnosis not present

## 2016-08-06 DIAGNOSIS — H04123 Dry eye syndrome of bilateral lacrimal glands: Secondary | ICD-10-CM | POA: Diagnosis not present

## 2016-08-18 DIAGNOSIS — H04123 Dry eye syndrome of bilateral lacrimal glands: Secondary | ICD-10-CM | POA: Diagnosis not present

## 2016-08-19 DIAGNOSIS — M24811 Other specific joint derangements of right shoulder, not elsewhere classified: Secondary | ICD-10-CM | POA: Diagnosis not present

## 2016-09-01 ENCOUNTER — Ambulatory Visit: Payer: Federal, State, Local not specified - PPO

## 2016-09-01 DIAGNOSIS — H04123 Dry eye syndrome of bilateral lacrimal glands: Secondary | ICD-10-CM | POA: Diagnosis not present

## 2016-09-01 DIAGNOSIS — M450 Ankylosing spondylitis of multiple sites in spine: Secondary | ICD-10-CM | POA: Diagnosis not present

## 2016-09-08 ENCOUNTER — Ambulatory Visit
Admission: RE | Admit: 2016-09-08 | Discharge: 2016-09-08 | Disposition: A | Discharge status: Home / Self Care | Payer: Federal, State, Local not specified - PPO | Source: Ambulatory Visit | Attending: Acute Care | Admitting: Acute Care

## 2016-09-08 DIAGNOSIS — F17210 Nicotine dependence, cigarettes, uncomplicated: Secondary | ICD-10-CM | POA: Diagnosis not present

## 2016-09-14 ENCOUNTER — Telehealth: Payer: Self-pay | Admitting: Acute Care

## 2016-09-14 DIAGNOSIS — F17210 Nicotine dependence, cigarettes, uncomplicated: Secondary | ICD-10-CM

## 2016-09-14 NOTE — Telephone Encounter (Signed)
I have called Candace Medina with the results of her low-dose screening CT. I explained that her scan was read as a Lung RADS 2: nodules that are benign in appearance and behavior with a very low likelihood of becoming a clinically active cancer due to size or lack of growth. Recommendation per radiology is for a repeat LDCT in 12 months. I told her that we would order and schedule her follow-up scan for December 2018. I also explained that the scan indicated aortic atherosclerosis and two-vessel coronary artery disease. I explained that this is a non-gated exam and degree or severity cannot be determined. She is currently on statin medication per her primary care physician, and has her cholesterol and triglycerides checked on a regular basis. Her that I would send a copy of this report to Dr. Brigitte Pulse for thoroughness of her medical record. We also discussed the fact that there was notation of emphysema. She verbalized understanding of the above and had no further questions.

## 2016-09-16 DIAGNOSIS — M7501 Adhesive capsulitis of right shoulder: Secondary | ICD-10-CM | POA: Diagnosis not present

## 2016-09-29 DIAGNOSIS — F329 Major depressive disorder, single episode, unspecified: Secondary | ICD-10-CM | POA: Diagnosis not present

## 2016-09-29 DIAGNOSIS — M859 Disorder of bone density and structure, unspecified: Secondary | ICD-10-CM | POA: Diagnosis not present

## 2016-10-06 DIAGNOSIS — M450 Ankylosing spondylitis of multiple sites in spine: Secondary | ICD-10-CM | POA: Diagnosis not present

## 2016-10-10 DIAGNOSIS — J09X2 Influenza due to identified novel influenza A virus with other respiratory manifestations: Secondary | ICD-10-CM | POA: Diagnosis not present

## 2016-10-10 DIAGNOSIS — R05 Cough: Secondary | ICD-10-CM | POA: Diagnosis not present

## 2016-10-10 DIAGNOSIS — R509 Fever, unspecified: Secondary | ICD-10-CM | POA: Diagnosis not present

## 2016-10-20 DIAGNOSIS — R05 Cough: Secondary | ICD-10-CM | POA: Diagnosis not present

## 2016-10-20 DIAGNOSIS — Z6833 Body mass index (BMI) 33.0-33.9, adult: Secondary | ICD-10-CM | POA: Diagnosis not present

## 2016-10-20 DIAGNOSIS — E119 Type 2 diabetes mellitus without complications: Secondary | ICD-10-CM | POA: Diagnosis not present

## 2016-11-03 DIAGNOSIS — F3289 Other specified depressive episodes: Secondary | ICD-10-CM | POA: Diagnosis not present

## 2016-11-10 DIAGNOSIS — M450 Ankylosing spondylitis of multiple sites in spine: Secondary | ICD-10-CM | POA: Diagnosis not present

## 2016-11-10 DIAGNOSIS — Z79899 Other long term (current) drug therapy: Secondary | ICD-10-CM | POA: Diagnosis not present

## 2016-11-10 DIAGNOSIS — M545 Low back pain: Secondary | ICD-10-CM | POA: Diagnosis not present

## 2016-11-10 DIAGNOSIS — M150 Primary generalized (osteo)arthritis: Secondary | ICD-10-CM | POA: Diagnosis not present

## 2016-11-16 DIAGNOSIS — H04123 Dry eye syndrome of bilateral lacrimal glands: Secondary | ICD-10-CM | POA: Diagnosis not present

## 2016-12-05 DIAGNOSIS — M450 Ankylosing spondylitis of multiple sites in spine: Secondary | ICD-10-CM | POA: Diagnosis not present

## 2016-12-08 DIAGNOSIS — M810 Age-related osteoporosis without current pathological fracture: Secondary | ICD-10-CM | POA: Diagnosis not present

## 2016-12-08 DIAGNOSIS — E119 Type 2 diabetes mellitus without complications: Secondary | ICD-10-CM | POA: Diagnosis not present

## 2016-12-08 DIAGNOSIS — F3289 Other specified depressive episodes: Secondary | ICD-10-CM | POA: Diagnosis not present

## 2016-12-08 DIAGNOSIS — Z1389 Encounter for screening for other disorder: Secondary | ICD-10-CM | POA: Diagnosis not present

## 2016-12-08 DIAGNOSIS — E784 Other hyperlipidemia: Secondary | ICD-10-CM | POA: Diagnosis not present

## 2017-01-10 ENCOUNTER — Encounter: Payer: Federal, State, Local not specified - PPO | Attending: Internal Medicine | Admitting: *Deleted

## 2017-01-10 DIAGNOSIS — Z713 Dietary counseling and surveillance: Secondary | ICD-10-CM | POA: Insufficient documentation

## 2017-01-10 DIAGNOSIS — E119 Type 2 diabetes mellitus without complications: Secondary | ICD-10-CM | POA: Insufficient documentation

## 2017-01-10 DIAGNOSIS — E782 Mixed hyperlipidemia: Secondary | ICD-10-CM | POA: Diagnosis not present

## 2017-01-10 DIAGNOSIS — Z6831 Body mass index (BMI) 31.0-31.9, adult: Secondary | ICD-10-CM | POA: Insufficient documentation

## 2017-01-10 NOTE — Progress Notes (Signed)

## 2017-01-17 ENCOUNTER — Encounter: Payer: Federal, State, Local not specified - PPO | Admitting: *Deleted

## 2017-01-17 DIAGNOSIS — Z713 Dietary counseling and surveillance: Secondary | ICD-10-CM | POA: Diagnosis not present

## 2017-01-17 DIAGNOSIS — E119 Type 2 diabetes mellitus without complications: Secondary | ICD-10-CM

## 2017-01-17 DIAGNOSIS — E782 Mixed hyperlipidemia: Secondary | ICD-10-CM | POA: Diagnosis not present

## 2017-01-17 DIAGNOSIS — Z6831 Body mass index (BMI) 31.0-31.9, adult: Secondary | ICD-10-CM | POA: Diagnosis not present

## 2017-01-17 NOTE — Progress Notes (Signed)

## 2017-01-24 ENCOUNTER — Encounter: Payer: Federal, State, Local not specified - PPO | Admitting: *Deleted

## 2017-01-24 DIAGNOSIS — Z713 Dietary counseling and surveillance: Secondary | ICD-10-CM | POA: Diagnosis not present

## 2017-01-24 DIAGNOSIS — Z6831 Body mass index (BMI) 31.0-31.9, adult: Secondary | ICD-10-CM | POA: Diagnosis not present

## 2017-01-24 DIAGNOSIS — E119 Type 2 diabetes mellitus without complications: Secondary | ICD-10-CM | POA: Diagnosis not present

## 2017-01-24 DIAGNOSIS — E782 Mixed hyperlipidemia: Secondary | ICD-10-CM | POA: Diagnosis not present

## 2017-01-24 NOTE — Progress Notes (Signed)
Patient was seen on 01/24/2017 for the third of a series of three diabetes self-management courses at the [facility].   Catalina Gravel the amount of activity recommended for healthy living . Describe activities suitable for individual needs . Identify ways to regularly incorporate activity into daily life . Identify barriers to activity and ways to over come these barriers  Identify diabetes medications being personally used and their primary action for lowering glucose and possible side effects . Describe role of stress on blood glucose and develop strategies to address psychosocial issues . Identify diabetes complications and ways to prevent them  Explain how to manage diabetes during illness . Evaluate success in meeting personal goal . Establish 2-3 goals that they will plan to diligently work on until they return for the  86-month follow-up visit  Goals:   I will count my carb choices at most meals and snacks  I will be active when I can  I will take my diabetes medications as scheduled  Your patient has identified these potential barriers to change:  Time  Your patient has identified their diabetes self-care support plan as  [employer] Website Family Support  Plan:  Attend Support Group as desired

## 2017-01-26 ENCOUNTER — Ambulatory Visit: Payer: Federal, State, Local not specified - PPO

## 2017-01-29 IMAGING — CT CT CHEST LUNG CANCER SCREENING LOW DOSE W/O CM
1 of 5 series · 14 of 40 positions shown, 18 images · non-contrast
Comparison: Low-dose lung cancer screening chest CT 08/31/2015.

CLINICAL DATA: 60-year-old female current smoker with 66 pack year
history of smoking. Lung cancer screening examination.

EXAM:
CT CHEST WITHOUT CONTRAST LOW-DOSE FOR LUNG CANCER SCREENING
TECHNIQUE: Multidetector CT imaging of the chest was performed following the
standard protocol without IV contrast.

[Series 3: lung windows · axial · 0.70mm/px · z∈[-268,-9]mm · 14 of 233 slices shown, 18 images]
[im 13/233  mediastinal]
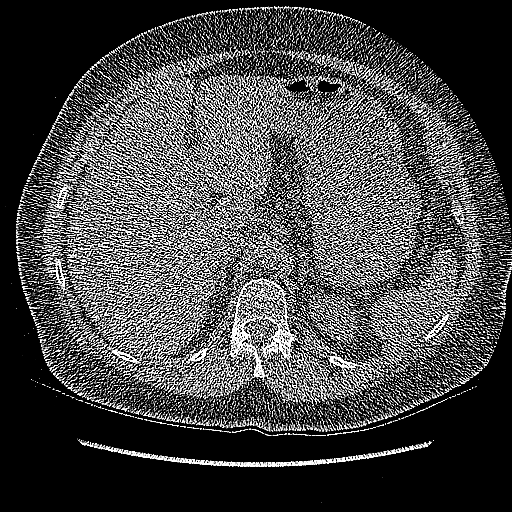
[im 13/233  lung]
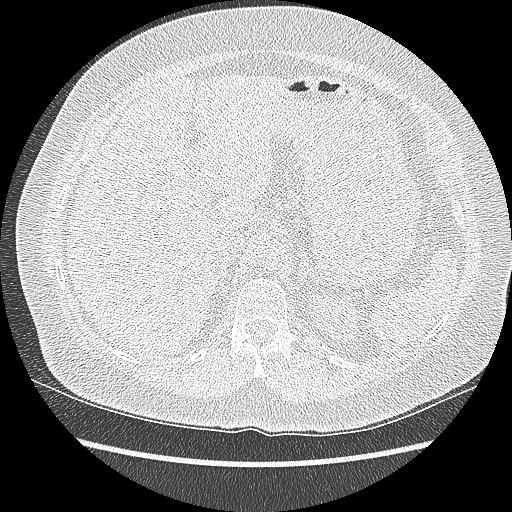
[im 25/233  lung]
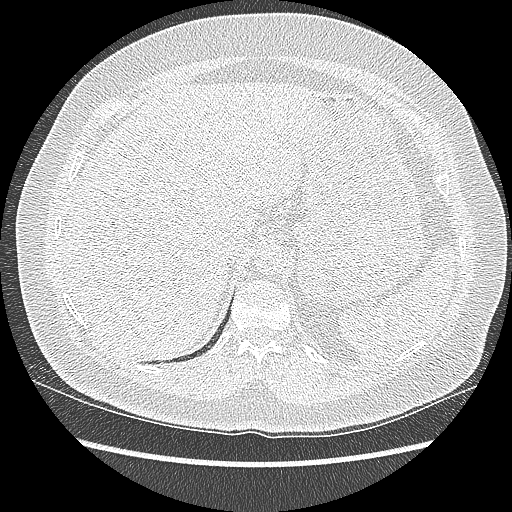
[im 49/233  lung]
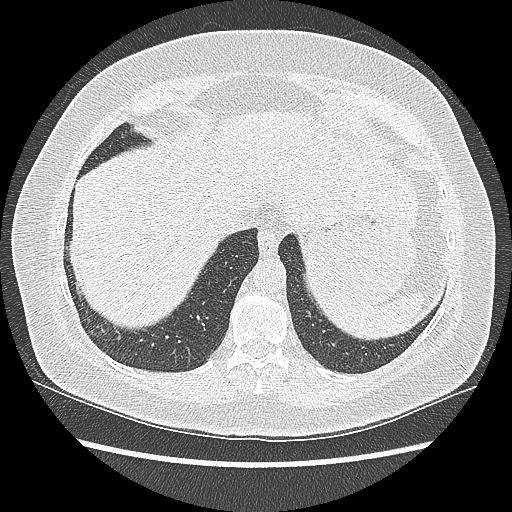
[im 62/233  lung]
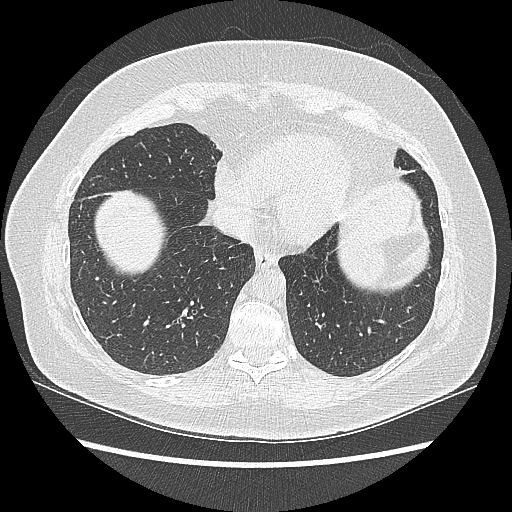
[im 74/233  mediastinal]
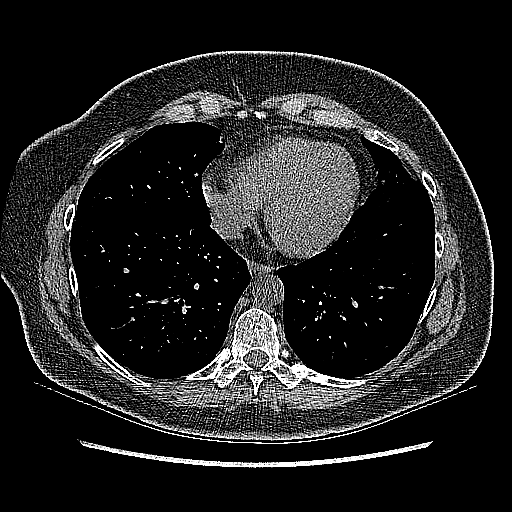
[im 74/233  lung]
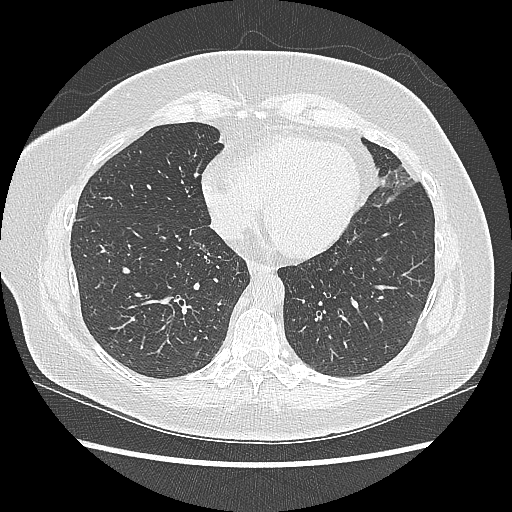
[im 98/233  lung]
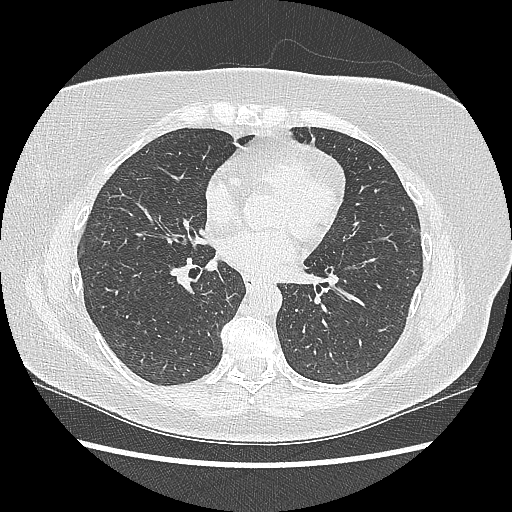
[im 110/233  lung]
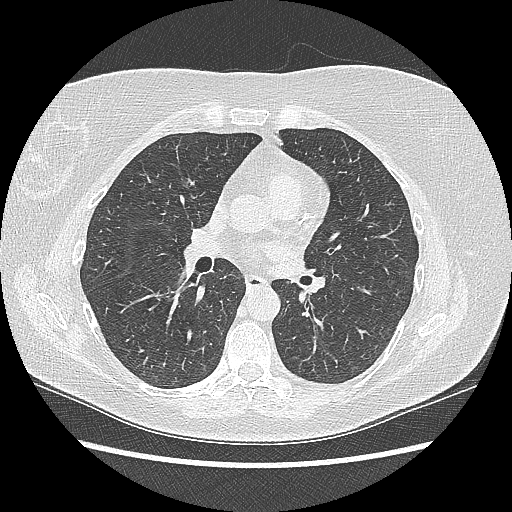
[im 123/233  lung]
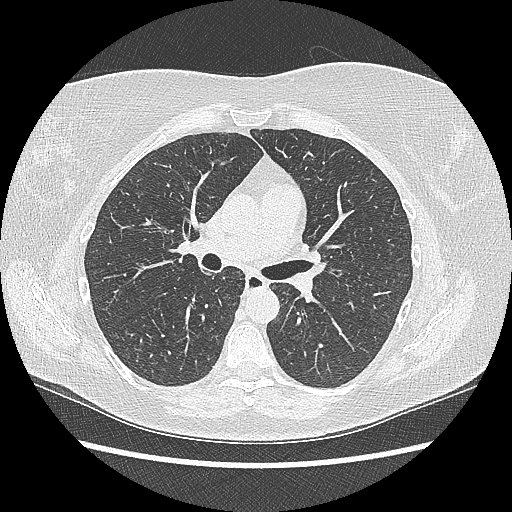
[im 135/233  mediastinal]
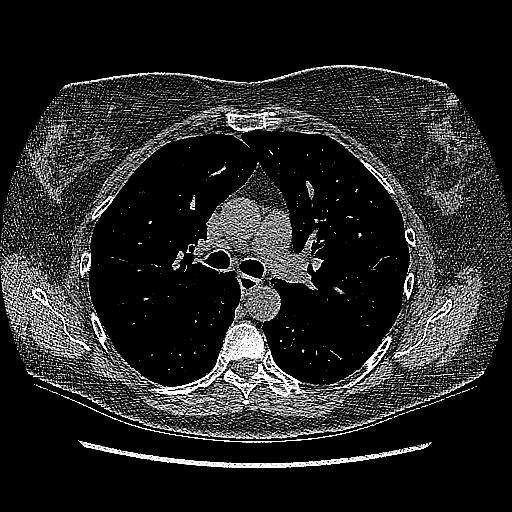
[im 135/233  lung]
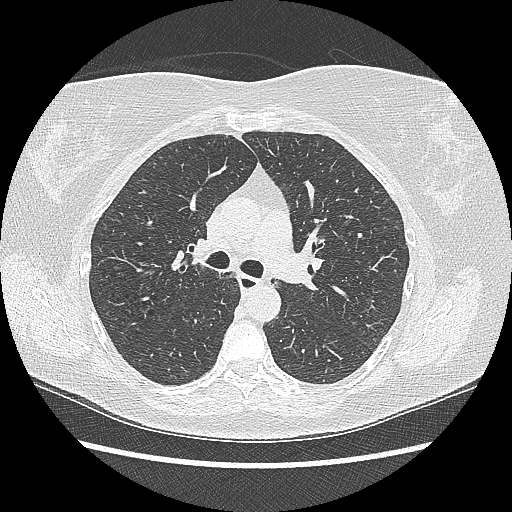
[im 159/233  lung]
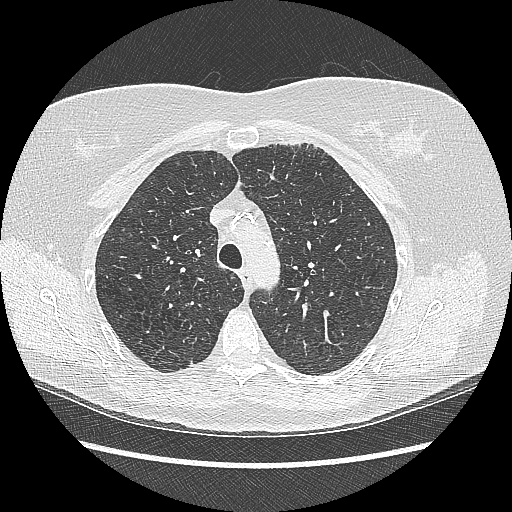
[im 171/233  lung]
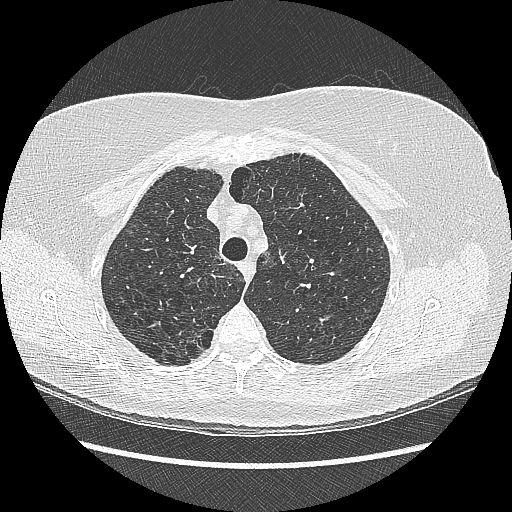
[im 184/233  lung]
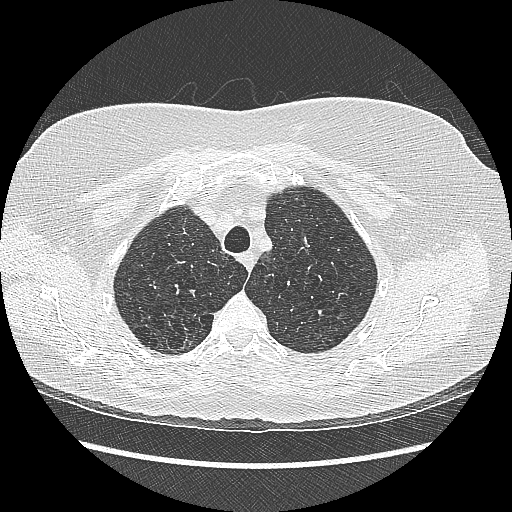
[im 208/233  mediastinal]
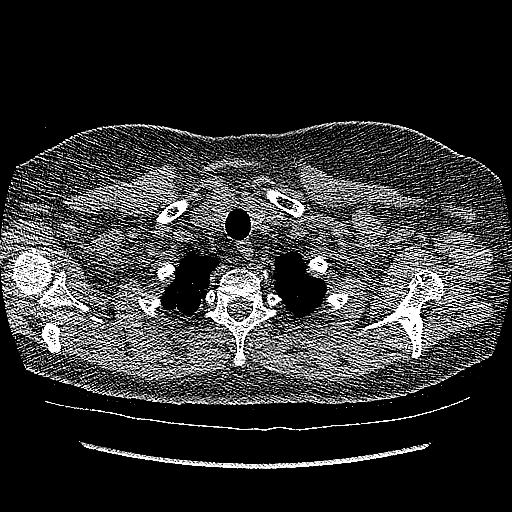
[im 208/233  lung]
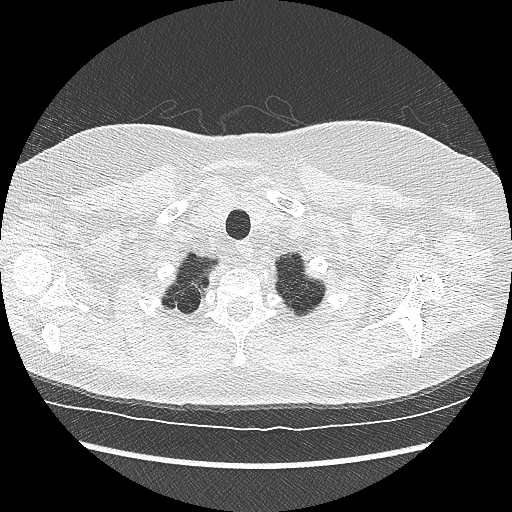
[im 220/233  lung]
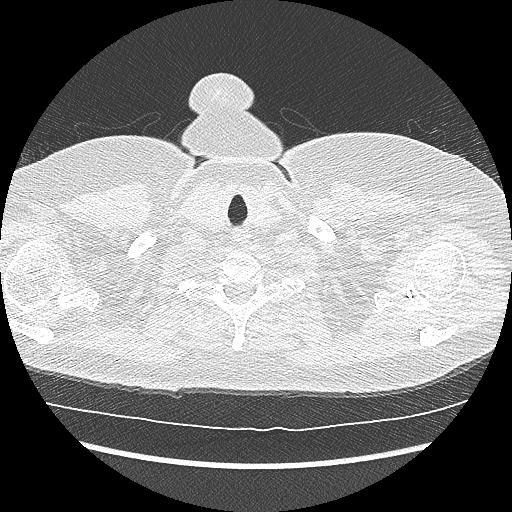

[14 of 40 positions shown; findings below may reference images not displayed]

FINDINGS: Cardiovascular: Heart size is normal. There is no significant
pericardial fluid, thickening or pericardial calcification. There is
aortic atherosclerosis, as well as atherosclerosis of the great
vessels of the mediastinum and the coronary arteries, including
calcified atherosclerotic plaque in the left anterior descending and
right coronary arteries.

Mediastinum/Nodes: No pathologically enlarged mediastinal or hilar
lymph nodes. Please note that accurate exclusion of hilar adenopathy
is limited on noncontrast CT scans. Esophagus is unremarkable in
appearance. No axillary lymphadenopathy.

Lungs/Pleura: Several tiny pulmonary nodules are noted, largest of
which has a volume derived mean diameter of only 1.5 mm in the
periphery of the left lower lobe (image 144 of series 3). No larger
more suspicious appearing pulmonary nodules or masses are noted.
Mild diffuse bronchial wall thickening with mild to moderate
centrilobular and paraseptal emphysema. No acute consolidative
airspace disease. No pleural effusions.

Upper Abdomen: Aortic atherosclerosis.

Musculoskeletal: There are no aggressive appearing lytic or blastic
lesions noted in the visualized portions of the skeleton.
IMPRESSION: 1. Lung-RADS Category 2S, benign appearance or behavior. Continue
annual screening with low-dose chest CT without contrast in 12
months
2. The "S" modifier above refers to potentially clinically
significant non lung cancer related findings. Specifically, there is
aortic atherosclerosis, in addition to 2 vessel coronary artery
disease. Please note that although the presence of coronary artery
calcium documents the presence of coronary artery disease, the
severity of this disease and any potential stenosis cannot be
assessed on this non-gated CT examination. Assessment for potential
risk factor modification, dietary therapy or pharmacologic therapy
may be warranted, if clinically indicated.
3. Mild diffuse bronchial wall thickening with mild to moderate
centrilobular and paraseptal emphysema; imaging findings suggestive
of underlying COPD.

## 2017-01-30 DIAGNOSIS — M450 Ankylosing spondylitis of multiple sites in spine: Secondary | ICD-10-CM | POA: Diagnosis not present

## 2017-01-30 DIAGNOSIS — Z79899 Other long term (current) drug therapy: Secondary | ICD-10-CM | POA: Diagnosis not present

## 2017-02-08 DIAGNOSIS — H04123 Dry eye syndrome of bilateral lacrimal glands: Secondary | ICD-10-CM | POA: Diagnosis not present

## 2017-02-21 DIAGNOSIS — M7541 Impingement syndrome of right shoulder: Secondary | ICD-10-CM | POA: Diagnosis not present

## 2017-02-21 DIAGNOSIS — M19011 Primary osteoarthritis, right shoulder: Secondary | ICD-10-CM | POA: Diagnosis not present

## 2017-02-21 DIAGNOSIS — M75111 Incomplete rotator cuff tear or rupture of right shoulder, not specified as traumatic: Secondary | ICD-10-CM | POA: Diagnosis not present

## 2017-02-21 DIAGNOSIS — M7551 Bursitis of right shoulder: Secondary | ICD-10-CM | POA: Diagnosis not present

## 2017-02-21 DIAGNOSIS — M25511 Pain in right shoulder: Secondary | ICD-10-CM | POA: Diagnosis not present

## 2017-02-21 DIAGNOSIS — G8918 Other acute postprocedural pain: Secondary | ICD-10-CM | POA: Diagnosis not present

## 2017-02-28 DIAGNOSIS — K080 Exfoliation of teeth due to systemic causes: Secondary | ICD-10-CM | POA: Diagnosis not present

## 2017-03-01 DIAGNOSIS — M450 Ankylosing spondylitis of multiple sites in spine: Secondary | ICD-10-CM | POA: Diagnosis not present

## 2017-03-01 DIAGNOSIS — M150 Primary generalized (osteo)arthritis: Secondary | ICD-10-CM | POA: Diagnosis not present

## 2017-03-01 DIAGNOSIS — Z79899 Other long term (current) drug therapy: Secondary | ICD-10-CM | POA: Diagnosis not present

## 2017-03-01 DIAGNOSIS — M545 Low back pain: Secondary | ICD-10-CM | POA: Diagnosis not present

## 2017-03-03 DIAGNOSIS — M19011 Primary osteoarthritis, right shoulder: Secondary | ICD-10-CM | POA: Diagnosis not present

## 2017-03-08 DIAGNOSIS — M25611 Stiffness of right shoulder, not elsewhere classified: Secondary | ICD-10-CM | POA: Diagnosis not present

## 2017-03-08 DIAGNOSIS — M25511 Pain in right shoulder: Secondary | ICD-10-CM | POA: Diagnosis not present

## 2017-03-08 DIAGNOSIS — M75111 Incomplete rotator cuff tear or rupture of right shoulder, not specified as traumatic: Secondary | ICD-10-CM | POA: Diagnosis not present

## 2017-03-14 DIAGNOSIS — M25511 Pain in right shoulder: Secondary | ICD-10-CM | POA: Diagnosis not present

## 2017-03-14 DIAGNOSIS — M25611 Stiffness of right shoulder, not elsewhere classified: Secondary | ICD-10-CM | POA: Diagnosis not present

## 2017-03-14 DIAGNOSIS — M75111 Incomplete rotator cuff tear or rupture of right shoulder, not specified as traumatic: Secondary | ICD-10-CM | POA: Diagnosis not present

## 2017-03-21 DIAGNOSIS — K080 Exfoliation of teeth due to systemic causes: Secondary | ICD-10-CM | POA: Diagnosis not present

## 2017-03-22 DIAGNOSIS — M25511 Pain in right shoulder: Secondary | ICD-10-CM | POA: Diagnosis not present

## 2017-03-22 DIAGNOSIS — M25611 Stiffness of right shoulder, not elsewhere classified: Secondary | ICD-10-CM | POA: Diagnosis not present

## 2017-03-22 DIAGNOSIS — M75111 Incomplete rotator cuff tear or rupture of right shoulder, not specified as traumatic: Secondary | ICD-10-CM | POA: Diagnosis not present

## 2017-03-27 DIAGNOSIS — M450 Ankylosing spondylitis of multiple sites in spine: Secondary | ICD-10-CM | POA: Diagnosis not present

## 2017-03-29 DIAGNOSIS — M25511 Pain in right shoulder: Secondary | ICD-10-CM | POA: Diagnosis not present

## 2017-03-29 DIAGNOSIS — M75111 Incomplete rotator cuff tear or rupture of right shoulder, not specified as traumatic: Secondary | ICD-10-CM | POA: Diagnosis not present

## 2017-03-29 DIAGNOSIS — M25611 Stiffness of right shoulder, not elsewhere classified: Secondary | ICD-10-CM | POA: Diagnosis not present

## 2017-04-03 DIAGNOSIS — M25511 Pain in right shoulder: Secondary | ICD-10-CM | POA: Diagnosis not present

## 2017-04-03 DIAGNOSIS — M25611 Stiffness of right shoulder, not elsewhere classified: Secondary | ICD-10-CM | POA: Diagnosis not present

## 2017-04-03 DIAGNOSIS — M75111 Incomplete rotator cuff tear or rupture of right shoulder, not specified as traumatic: Secondary | ICD-10-CM | POA: Diagnosis not present

## 2017-04-24 DIAGNOSIS — K080 Exfoliation of teeth due to systemic causes: Secondary | ICD-10-CM | POA: Diagnosis not present

## 2017-05-22 DIAGNOSIS — M450 Ankylosing spondylitis of multiple sites in spine: Secondary | ICD-10-CM | POA: Diagnosis not present

## 2017-06-07 DIAGNOSIS — M545 Low back pain: Secondary | ICD-10-CM | POA: Diagnosis not present

## 2017-06-07 DIAGNOSIS — M150 Primary generalized (osteo)arthritis: Secondary | ICD-10-CM | POA: Diagnosis not present

## 2017-06-07 DIAGNOSIS — M2550 Pain in unspecified joint: Secondary | ICD-10-CM | POA: Diagnosis not present

## 2017-06-07 DIAGNOSIS — M450 Ankylosing spondylitis of multiple sites in spine: Secondary | ICD-10-CM | POA: Diagnosis not present

## 2017-07-06 DIAGNOSIS — R05 Cough: Secondary | ICD-10-CM | POA: Diagnosis not present

## 2017-07-06 DIAGNOSIS — J189 Pneumonia, unspecified organism: Secondary | ICD-10-CM | POA: Diagnosis not present

## 2017-07-06 DIAGNOSIS — F17200 Nicotine dependence, unspecified, uncomplicated: Secondary | ICD-10-CM | POA: Diagnosis not present

## 2017-07-20 ENCOUNTER — Other Ambulatory Visit: Payer: Self-pay | Admitting: Acute Care

## 2017-07-20 DIAGNOSIS — Z122 Encounter for screening for malignant neoplasm of respiratory organs: Secondary | ICD-10-CM

## 2017-07-20 DIAGNOSIS — F17210 Nicotine dependence, cigarettes, uncomplicated: Secondary | ICD-10-CM

## 2017-08-02 DIAGNOSIS — E7849 Other hyperlipidemia: Secondary | ICD-10-CM | POA: Diagnosis not present

## 2017-08-02 DIAGNOSIS — M810 Age-related osteoporosis without current pathological fracture: Secondary | ICD-10-CM | POA: Diagnosis not present

## 2017-08-02 DIAGNOSIS — E119 Type 2 diabetes mellitus without complications: Secondary | ICD-10-CM | POA: Diagnosis not present

## 2017-08-09 DIAGNOSIS — Z0000 Encounter for general adult medical examination without abnormal findings: Secondary | ICD-10-CM | POA: Diagnosis not present

## 2017-08-09 DIAGNOSIS — E119 Type 2 diabetes mellitus without complications: Secondary | ICD-10-CM | POA: Diagnosis not present

## 2017-08-09 DIAGNOSIS — Z1389 Encounter for screening for other disorder: Secondary | ICD-10-CM | POA: Diagnosis not present

## 2017-08-09 DIAGNOSIS — J189 Pneumonia, unspecified organism: Secondary | ICD-10-CM | POA: Diagnosis not present

## 2017-08-09 DIAGNOSIS — F17200 Nicotine dependence, unspecified, uncomplicated: Secondary | ICD-10-CM | POA: Diagnosis not present

## 2017-08-09 DIAGNOSIS — K080 Exfoliation of teeth due to systemic causes: Secondary | ICD-10-CM | POA: Diagnosis not present

## 2017-08-09 DIAGNOSIS — J449 Chronic obstructive pulmonary disease, unspecified: Secondary | ICD-10-CM | POA: Diagnosis not present

## 2017-08-09 DIAGNOSIS — E7849 Other hyperlipidemia: Secondary | ICD-10-CM | POA: Diagnosis not present

## 2017-08-09 DIAGNOSIS — Z23 Encounter for immunization: Secondary | ICD-10-CM | POA: Diagnosis not present

## 2017-08-24 DIAGNOSIS — B9789 Other viral agents as the cause of diseases classified elsewhere: Secondary | ICD-10-CM | POA: Diagnosis not present

## 2017-08-24 DIAGNOSIS — R509 Fever, unspecified: Secondary | ICD-10-CM | POA: Diagnosis not present

## 2017-08-24 DIAGNOSIS — Z6832 Body mass index (BMI) 32.0-32.9, adult: Secondary | ICD-10-CM | POA: Diagnosis not present

## 2017-08-24 DIAGNOSIS — R52 Pain, unspecified: Secondary | ICD-10-CM | POA: Diagnosis not present

## 2017-08-30 ENCOUNTER — Ambulatory Visit (INDEPENDENT_AMBULATORY_CARE_PROVIDER_SITE_OTHER): Payer: Federal, State, Local not specified - PPO | Admitting: Orthopaedic Surgery

## 2017-09-08 ENCOUNTER — Ambulatory Visit
Admission: RE | Admit: 2017-09-08 | Discharge: 2017-09-08 | Disposition: A | Discharge status: Home / Self Care | Payer: Federal, State, Local not specified - PPO | Source: Ambulatory Visit | Attending: Acute Care | Admitting: Acute Care

## 2017-09-08 DIAGNOSIS — Z122 Encounter for screening for malignant neoplasm of respiratory organs: Secondary | ICD-10-CM

## 2017-09-08 DIAGNOSIS — F17210 Nicotine dependence, cigarettes, uncomplicated: Secondary | ICD-10-CM

## 2017-09-12 HISTORY — PX: LAPAROSCOPIC CHOLECYSTECTOMY: SUR755

## 2017-09-13 ENCOUNTER — Ambulatory Visit: Payer: Self-pay | Admitting: Surgery

## 2017-09-13 DIAGNOSIS — K811 Chronic cholecystitis: Secondary | ICD-10-CM | POA: Diagnosis not present

## 2017-09-13 NOTE — H&P (Signed)
History of Present Illness Candace Medina. Candace Cormier MD; 09/13/2017 10:45 AM) The patient is a 62 year old female who presents for evaluation of gall stones. Referred by Dr. Carlean Purl for evaluation of gallbladder  This is a 62 year old female with a complicated past medical history including ankylosing spondylitis, irritable bowel syndrome, who presents with at least a year and a half of intermittent right upper quadrant abdominal pain that is now radiating through to her back. This has become more frequent and now occurs a couple times a day. Sometimes this is associated with eating but sometimes she gets the pain without eating. She denies any significant nausea or vomiting. Patient has very irregular bowel movements and alternates between constipation and diarrhea. She is on a special low-fat diet.  2015 she had a workup by Dr. Brigitte Pulse for this right upper quadrant pain. Ultrasound and HIDA scan were read as normal. However she had significant exacerbation of her right upper quadrant pain with the CCK administration. Based on these findings and her continued pain, Dr. Carlean Purl asked her to come see [place] about possible elective cholecystectomy. We evaluated her initially in 2016 and offered her surgery at that time based on her symptoms. However she declined to schedule surgery. Her symptoms have become significantly worse. She is having frequent episodes of right upper quadrant abdominal pain associated with radiation around to her flank and her shoulder. She also has persistent nausea. She reports frequent diarrhea. She has not had any further imaging. Based on her worsening symptoms, Dr. Brigitte Pulse has referred her back to [place] for evaluation for cholecystectomy.   CLINICAL DATA: Right upper quadrant pain. EXAM: US ABDOMEN LIMITED - RIGHT UPPER QUADRANT COMPARISON: None. FINDINGS: Gallbladder No gallstones or wall thickening visualized. No sonographic Murphy sign noted. Common bile duct Diameter:  1.9 mm Liver: No focal lesion identified. Suggestion of mild fatty infiltration. IMPRESSION: No acute hepatobiliary findings. Suggestion mild fatty infiltration of the liver. Electronically Signed By: Candace Medina M.D. On: 07/15/2013 08:14      CLINICAL DATA: Right upper quadrant pain  EXAM: NUCLEAR MEDICINE HEPATOBILIARY IMAGING WITH GALLBLADDER EF  TECHNIQUE: Sequential images of the abdomen were obtained out to 60 minutes following intravenous administration of radiopharmaceutical. After slow intravenous infusion of 1.6 micrograms Cholecystokinin, gallbladder ejection fraction was determined.  RADIOPHARMACEUTICALS: 4.9 [place] [id number] Choletec  COMPARISON: None.  FINDINGS: Uptake within the intrahepatic bile ducts and gallbladder is visualized at 10 min. Uptake within small bowel is evident by 15 min. At 30 min, normal ejection fraction is greater than 30%.  The patient did experience symptoms during CCK infusion.  IMPRESSION: 1. Normal visualization of the gallbladder consistent with patency of the cystic duct. 2. The gallbladder ejection fraction is greater than 30%. 3. The patient did experience pain during CCK infusion.   Electronically Signed By: Candace Medina M.D. On: 02/07/2014 11:52   Problem List/Past Medical Candace Key K. Haidee Stogsdill, MD; 09/13/2017 10:46 AM) CHRONIC CHOLECYSTITIS (K81.1)  Past Surgical History Candace Key K. Andrian Sabala, MD; 09/13/2017 10:46 AM) Colon Polyp Removal - Colonoscopy Hysterectomy (not due to cancer) - Partial Shoulder Surgery Bilateral. Tonsillectomy  Diagnostic Studies History (Candace List K. Ercilia Bettinger, MD; 09/13/2017 10:46 AM) Colonoscopy within last year Mammogram 1-3 years ago Pap Smear >5 years ago  Allergies Candace Medina, RMA; 09/13/2017 10:23 AM) Codeine Phosphate *ANALGESICS - OPIOID* Keflex *CEPHALOSPORINS* Morphine Sulfate (Concentrate) *ANALGESICS - OPIOID* Boniva *ENDOCRINE AND METABOLIC AGENTS -  MISC.* Venomil Honey Bee Venom *BIOLOGICALS MISC* Allergies Reconciled  Medication History Candace Medina, RMA; 09/13/2017 10:25 AM) TraMADol  HCl (50MG  Tablet, Oral) Active. Enbrel SureClick (50MG /ML Soln Auto-inj, Subcutaneous) Active. Etodolac (500MG  Tablet, Oral) Active. Fluticasone Propionate (50MCG/ACT Suspension, Nasal) Active. Omeprazole (20MG  Capsule DR, Oral) Active. Symbicort (80-4.5MCG/ACT Aerosol, Inhalation) Active. Ventolin HFA (108 (90 Base)MCG/ACT Aerosol Soln, Inhalation) Active. Vitamin D (Ergocalciferol) (50000UNIT Capsule, Oral) Active. Clobetasol Propionate (0.05% Cream, External as needed) Active. Anoro Ellipta (62.5-25MCG/INH Aero Pow Br Act, Inhalation) Active. MetFORMIN HCl (1000MG  Tablet, Oral) Active. Rosuvastatin Calcium (10MG  Tablet, Oral) Active. Accu-Chek Guide (In Vitro) Active. Chantix (1MG  Tablet, Oral) Active. Medications Reconciled Diflucan (50MG  Tablet, Oral) Active.  Social History Candace Medina. Candace Chrostowski, MD; 09/13/2017 10:46 AM) Alcohol use Occasional alcohol use. Caffeine use Coffee. No drug use Tobacco use Current every day smoker.  Family History Candace Medina. Candace Hallmon, MD; 09/13/2017 10:46 AM) Alcohol Abuse Brother, Mother. Arthritis Father. Cancer Father. Diabetes Mellitus Father. Melanoma Brother. Respiratory Condition Mother.  Pregnancy / Birth History Candace Medina. Candace Chizmar, MD; 09/13/2017 10:46 AM) Age at menarche 40 years. Age of menopause <46 Gravida 2 Maternal age 29-25 Para 2  Other Problems Candace Medina. Candace Hedgecock, MD; 09/13/2017 10:46 AM) Arthritis Back Pain Chronic Obstructive Lung Disease Gastroesophageal Reflux Disease General anesthesia - complications Hemorrhoids Migraine Headache    Vitals Candace Medina RMA; 09/13/2017 10:23 AM) 09/13/2017 10:22 AM Weight: 184.8 lb Height: 62in Body Surface Area: 1.85 m Body Mass Index: 33.8 kg/m  Temp.: 97.68F  Pulse: 97 (Regular)  BP: 120/72  (Sitting, Left Arm, Standard)      Physical Exam Candace Key K. Madilyne Tadlock MD; 09/13/2017 10:46 AM)  The physical exam findings are as follows: Note:WDWN in NAD HEENT: EOMI, sclera anicteric Neck: No masses, no thyromegaly Lungs: CTA bilaterally; normal respiratory effort CV: Regular rate and rhythm; no murmurs Abd: +bowel sounds, soft, mild RUQ tenderness; no masses Ext: Well-perfused; no edema Skin: Warm, dry; no sign of jaundice    Assessment & Plan Candace Key K. Harmonii Karle MD; 09/13/2017 10:47 AM)  CHRONIC CHOLECYSTITIS (K81.1)  Current Plans Schedule for Surgery - laparoscopic cholecystectomy with intraoperative cholangiogram. The surgical procedure has been discussed with the patient. Potential risks, benefits, alternative treatments, and expected outcomes have been explained. All of the patient's questions at this time have been answered. The likelihood of reaching the patient's treatment goal is good. The patient understand the proposed surgical procedure and wishes to proceed.  Candace Medina. Georgette Dover, MD, [facility]  General/ Trauma Surgery  09/13/2017 10:48 AM

## 2017-09-14 ENCOUNTER — Telehealth: Payer: Self-pay | Admitting: Acute Care

## 2017-09-14 DIAGNOSIS — F17210 Nicotine dependence, cigarettes, uncomplicated: Secondary | ICD-10-CM

## 2017-09-14 DIAGNOSIS — Z122 Encounter for screening for malignant neoplasm of respiratory organs: Secondary | ICD-10-CM

## 2017-09-14 NOTE — Telephone Encounter (Signed)
Pt informed of CT results per Sarah Groce, NP.  PT verbalized understanding.  Copy sent to PCP.  Order placed for 1 yr f/u CT.  

## 2017-10-05 ENCOUNTER — Other Ambulatory Visit: Payer: Self-pay | Admitting: Surgery

## 2017-10-05 DIAGNOSIS — K811 Chronic cholecystitis: Secondary | ICD-10-CM | POA: Diagnosis not present

## 2017-10-09 DIAGNOSIS — M450 Ankylosing spondylitis of multiple sites in spine: Secondary | ICD-10-CM | POA: Diagnosis not present

## 2017-10-09 DIAGNOSIS — M2550 Pain in unspecified joint: Secondary | ICD-10-CM | POA: Diagnosis not present

## 2017-10-09 DIAGNOSIS — M545 Low back pain: Secondary | ICD-10-CM | POA: Diagnosis not present

## 2017-10-09 DIAGNOSIS — M150 Primary generalized (osteo)arthritis: Secondary | ICD-10-CM | POA: Diagnosis not present

## 2017-11-22 DIAGNOSIS — H04123 Dry eye syndrome of bilateral lacrimal glands: Secondary | ICD-10-CM | POA: Diagnosis not present

## 2017-11-29 DIAGNOSIS — H04123 Dry eye syndrome of bilateral lacrimal glands: Secondary | ICD-10-CM | POA: Diagnosis not present

## 2017-12-11 ENCOUNTER — Encounter: Payer: Self-pay | Admitting: Internal Medicine

## 2017-12-20 DIAGNOSIS — H04123 Dry eye syndrome of bilateral lacrimal glands: Secondary | ICD-10-CM | POA: Diagnosis not present

## 2017-12-23 DIAGNOSIS — Z1231 Encounter for screening mammogram for malignant neoplasm of breast: Secondary | ICD-10-CM | POA: Diagnosis not present

## 2018-01-02 DIAGNOSIS — N6002 Solitary cyst of left breast: Secondary | ICD-10-CM | POA: Diagnosis not present

## 2018-01-10 DIAGNOSIS — R197 Diarrhea, unspecified: Secondary | ICD-10-CM | POA: Diagnosis not present

## 2018-01-10 DIAGNOSIS — F17200 Nicotine dependence, unspecified, uncomplicated: Secondary | ICD-10-CM | POA: Diagnosis not present

## 2018-01-10 DIAGNOSIS — R1084 Generalized abdominal pain: Secondary | ICD-10-CM | POA: Diagnosis not present

## 2018-01-10 DIAGNOSIS — R05 Cough: Secondary | ICD-10-CM | POA: Diagnosis not present

## 2018-01-22 DIAGNOSIS — Z9049 Acquired absence of other specified parts of digestive tract: Secondary | ICD-10-CM | POA: Diagnosis not present

## 2018-01-24 ENCOUNTER — Emergency Department (HOSPITAL_COMMUNITY)
Admission: EM | Admit: 2018-01-24 | Discharge: 2018-01-24 | Discharge status: Against Medical Advice | Payer: Federal, State, Local not specified - PPO | Attending: Emergency Medicine | Admitting: Emergency Medicine

## 2018-01-24 ENCOUNTER — Other Ambulatory Visit: Payer: Self-pay

## 2018-01-24 ENCOUNTER — Encounter (HOSPITAL_COMMUNITY): Payer: Self-pay | Admitting: Emergency Medicine

## 2018-01-24 ENCOUNTER — Emergency Department (HOSPITAL_COMMUNITY): Payer: Federal, State, Local not specified - PPO

## 2018-01-24 DIAGNOSIS — Z5321 Procedure and treatment not carried out due to patient leaving prior to being seen by health care provider: Secondary | ICD-10-CM | POA: Insufficient documentation

## 2018-01-24 DIAGNOSIS — R072 Precordial pain: Secondary | ICD-10-CM | POA: Diagnosis not present

## 2018-01-24 DIAGNOSIS — R079 Chest pain, unspecified: Secondary | ICD-10-CM | POA: Diagnosis not present

## 2018-01-24 LAB — CBC
HCT: 43 % (ref 36.0–46.0)
HEMOGLOBIN: 14.2 g/dL (ref 12.0–15.0)
MCH: 28.4 pg (ref 26.0–34.0)
MCHC: 33 g/dL (ref 30.0–36.0)
MCV: 86 fL (ref 78.0–100.0)
PLATELETS: 300 10*3/uL (ref 150–400)
RBC: 5 MIL/uL (ref 3.87–5.11)
RDW: 13.8 % (ref 11.5–15.5)
WBC: 8.6 10*3/uL (ref 4.0–10.5)

## 2018-01-24 LAB — BASIC METABOLIC PANEL
Anion gap: 11 (ref 5–15)
BUN: 13 mg/dL (ref 6–20)
CALCIUM: 9.5 mg/dL (ref 8.9–10.3)
CHLORIDE: 106 mmol/L (ref 101–111)
CO2: 23 mmol/L (ref 22–32)
CREATININE: 0.95 mg/dL (ref 0.44–1.00)
GFR calc non Af Amer: >60 mL/min (ref >60)
Glucose, Bld: 169 mg/dL — ABNORMAL HIGH (ref 65–99)
Potassium: 3.7 mmol/L (ref 3.5–5.1)
Sodium: 140 mmol/L (ref 135–145)

## 2018-01-24 LAB — I-STAT TROPONIN, ED: TROPONIN I, POC: 0.01 ng/mL (ref 0.00–0.08)

## 2018-01-24 NOTE — ED Notes (Signed)
Unable to locate pt in waiting area 

## 2018-01-24 NOTE — ED Triage Notes (Signed)
Patient to ED c/o central CP radiating to L arm and L side of neck earlier today, lasting 45 minutes. Patient reports hx of same, but 14 years ago - states her MD thought it was r/t anxiety. Patient denies SOB, no dizziness/lightheadedness, no N/V. Patient currently denies pain, resp e/u, skin warm/dry.

## 2018-01-24 NOTE — ED Provider Notes (Cosign Needed)
Patient placed in Quick Look pathway, seen and evaluated   Chief Complaint: CP x2hrs  HPI:   Pt is a 62 y.o. female with a PMHx of asthma/COPD, ankylosing spondylitis, HLD, and arthitis, presenting today with c/o central chest pain that started 2 hours ago while she was sitting at work and got upset because of something that happened at work.  She states that it is a squeezing sensation that radiates into her left arm and neck, and has associated shortness of breath because the pain "takes her breath away".  She was admitted to [facility] with similar symptoms a while ago and eventually was discharged, was told that it was not her heart.  She denies any diaphoresis, lightheadedness, nausea, vomiting, cough, wheezing, leg swelling, recent travel/surgery/immobilization, estrogen use, personal history of DVT/PE, or any other complaints at this time.  She is a cigarette smoker.  Positive family history of MI in her brother at 42 years old.  ROS: +CP, +SOB. No diaphoresis, lightheadedness, nausea, vomiting, cough, wheezing, or leg swelling   Physical Exam:  BP (!) 148/91 (BP Location: Left Arm)   Pulse 100   Temp 98.5 F (36.9 C) (Oral)   Resp 16   Ht 5\' 2"  (1.575 m)   Wt 77.6 kg (171 lb)   SpO2 96%   BMI 31.28 kg/m    Gen: No distress  Neuro: Awake and Alert  Skin: Warm    Focused Exam: Heart: Rate 100, reg rhythm, nl s1/s2, no m/r/g, distal pulses intact, no pedal edema. Pulm: CTAB in all lung fields, no w/r/r, no hypoxia or increased WOB, speaking in full sentences, SpO2 96% on RA    Initiation of care has begun. The patient has been counseled on the process, plan, and necessity for staying for the completion/evaluation, and the remainder of the medical screening examination     [address], [place], [place] 01/24/18 1601

## 2018-02-06 DIAGNOSIS — M545 Low back pain: Secondary | ICD-10-CM | POA: Diagnosis not present

## 2018-02-06 DIAGNOSIS — M450 Ankylosing spondylitis of multiple sites in spine: Secondary | ICD-10-CM | POA: Diagnosis not present

## 2018-02-06 DIAGNOSIS — M150 Primary generalized (osteo)arthritis: Secondary | ICD-10-CM | POA: Diagnosis not present

## 2018-02-07 DIAGNOSIS — M542 Cervicalgia: Secondary | ICD-10-CM | POA: Diagnosis not present

## 2018-02-07 DIAGNOSIS — E7849 Other hyperlipidemia: Secondary | ICD-10-CM | POA: Diagnosis not present

## 2018-02-07 DIAGNOSIS — E119 Type 2 diabetes mellitus without complications: Secondary | ICD-10-CM | POA: Diagnosis not present

## 2018-02-07 DIAGNOSIS — F17200 Nicotine dependence, unspecified, uncomplicated: Secondary | ICD-10-CM | POA: Diagnosis not present

## 2018-02-07 DIAGNOSIS — R0789 Other chest pain: Secondary | ICD-10-CM | POA: Diagnosis not present

## 2018-03-16 DIAGNOSIS — M5136 Other intervertebral disc degeneration, lumbar region: Secondary | ICD-10-CM | POA: Diagnosis not present

## 2018-03-16 DIAGNOSIS — M5030 Other cervical disc degeneration, unspecified cervical region: Secondary | ICD-10-CM | POA: Diagnosis not present

## 2018-03-24 DIAGNOSIS — M5030 Other cervical disc degeneration, unspecified cervical region: Secondary | ICD-10-CM | POA: Diagnosis not present

## 2018-03-26 ENCOUNTER — Ambulatory Visit: Payer: Federal, State, Local not specified - PPO | Admitting: Internal Medicine

## 2018-03-26 ENCOUNTER — Encounter: Payer: Self-pay | Admitting: Internal Medicine

## 2018-03-26 VITALS — BP 124/80 | HR 76 | Ht 62.0 in | Wt 175.2 lb

## 2018-03-26 DIAGNOSIS — G8929 Other chronic pain: Secondary | ICD-10-CM

## 2018-03-26 DIAGNOSIS — Z86010 Personal history of colonic polyps: Secondary | ICD-10-CM

## 2018-03-26 DIAGNOSIS — E669 Obesity, unspecified: Secondary | ICD-10-CM

## 2018-03-26 DIAGNOSIS — R1011 Right upper quadrant pain: Secondary | ICD-10-CM | POA: Diagnosis not present

## 2018-03-26 DIAGNOSIS — K760 Fatty (change of) liver, not elsewhere classified: Secondary | ICD-10-CM | POA: Diagnosis not present

## 2018-03-26 NOTE — Progress Notes (Signed)
Candace Medina 62 y.o. 1955-11-11 [id number]  Assessment & Plan:   Encounter Diagnoses  Name Primary?  . Fatty liver Yes  . Chronic RUQ pain   . History of colonic polyps   . Obesity (BMI 30.0-34.9)     Her symptoms do fit with the pain of fatty liver.  I have explained that weight loss is the only good successful treatment to help fatty liver.  She will keep working on that and is congratulated on her efforts.  I have provided an educational handout on fatty liver  She says she can tolerate the pain she has she was just more wondering what might be possible to do and I do not think there is any medical treatment.  colonoscopy will be scheduled to follow-up history of colonic polyp.  I appreciate the opportunity to care for this patient. CC: Marton Redwood, MD Dr. Donnie Mesa  Subjective:   Chief Complaint: RUQ pain  HPI The patient presents for evaluation of right upper quadrant pain that had persisted despite cholecystectomy.  She does have known fatty liver.  She will feel swollen and a bit bloated up under the ribs, her up to 1 hour.  There is a pressure sensation.  Her symptoms do not disturb her sleep.  After her cholecystectomy she had some diarrhea issues.  She does have chronic alternating hard infrequent stools and loose stools.  She is intentionally losing weight by avoiding fried and greasy foods and dairy.  She wants to know more about fatty liver.  I last saw her in 2016, and in April she had an 18 mm a sending colon polyp with pathology reported as hyperplastic though it looked like a sessile serrated polyp to me and I recommended a 3-year interval colonoscopy.  She also had a normal EGD at that time and I tried hyoscyamine to help her pain.  Internal hemorrhoids were banded as well, and this helped considerably with fecal soiling issues.  June 2016.   Wt Readings from Last 3 Encounters:  03/26/18 175 lb 3.2 oz (79.5 kg)  01/24/18 171 lb (77.6 kg)    01/10/17 176 lb 12.8 oz (80.2 kg)    Allergies  Allergen Reactions  . Venomil Honey Bee Venom [Honey Bee Venom] Anaphylaxis    ALL VENOMOUS INSECTS   . Boniva [Ibandronic Acid]     Couldn't walk  . Keflex [Cephalexin] Itching  . Codeine Rash  . Morphine And Related Rash   Current Meds  Medication Sig  . albuterol (PROVENTIL HFA;VENTOLIN HFA) 108 (90 BASE) MCG/ACT inhaler Inhale into the lungs every 6 (six) hours as needed for wheezing or shortness of breath.  . ALPRAZolam (XANAX) 0.5 MG tablet Take 0.5 mg by mouth at bedtime as needed for anxiety or sleep.   . budesonide-formoterol (SYMBICORT) 80-4.5 MCG/ACT inhaler Inhale 2 puffs into the lungs 2 (two) times daily.  . CHANTIX 1 MG tablet Take 1 mg by mouth as directed.  . diclofenac (VOLTAREN) 75 MG EC tablet Take 75 mg by mouth 2 (two) times daily.  Marland Kitchen doxycycline (VIBRAMYCIN) 50 MG capsule Take 50 mg by mouth 3 (three) times daily.  Marland Kitchen EPINEPHrine 0.3 mg/0.3 mL IJ SOAJ injection Inject 0.3 mg into the muscle as needed for anaphylaxis.  Marland Kitchen ergocalciferol (VITAMIN D2) 50000 UNITS capsule Take 50,000 Units by mouth 2 (two) times a week.   . fluticasone (FLONASE) 50 MCG/ACT nasal spray Place 1 spray into the nose daily as needed for allergies or rhinitis.   Marland Kitchen  metFORMIN (GLUCOPHAGE) 1000 MG tablet Take 1,000 mg by mouth every evening.   Marland Kitchen omeprazole (PRILOSEC) 20 MG capsule Take 1 capsule by mouth daily.  Marland Kitchen PREVIDENT 5000 DRY MOUTH 1.1 % GEL dental gel daily.  . simvastatin (ZOCOR) 20 MG tablet Take 20 mg by mouth daily.  . traMADol (ULTRAM) 50 MG tablet Take by mouth every 6 (six) hours as needed.  Marland Kitchen XIIDRA 5 % SOLN Place 1 drop into both eyes 2 (two) times daily.  . [DISCONTINUED] Efinaconazole 10 % SOLN Apply 1 drop topically daily.   Past Medical History:  Diagnosis Date  . Ankylosing spondylitis ([place])   . Anxiety and depression   . Arthritis   . Asthma   . Colon polyps   . COPD (chronic obstructive pulmonary disease) ([place])    . GERD (gastroesophageal reflux disease)   . Hx of colonic polyps 12/30/2014  . Hyperlipidemia   . Internal hemorrhoids with prolapse - Gr 2 12/17/2014  . Migraines   . Nicotine addiction   . Osteopenia   . Plantar fasciitis   . Vitamin D deficiency    Past Surgical History:  Procedure Laterality Date  . COLONOSCOPY    . HEMORRHOID BANDING  02/2015  . LAPAROSCOPIC CHOLECYSTECTOMY  09/2017   Dr. Georgette Dover  . SHOULDER ARTHROSCOPY Right    bursitis  . SHOULDER SURGERY Left   . TONSILLECTOMY    . TOTAL ABDOMINAL HYSTERECTOMY  1986  . 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   Review of Systems As per HPI.  Was in the ER with some chest pain and may related to a stressful situation at work.  No cardiac issues with that.  She has her chronic symptoms of pain in the back with ankylosing spondylitis.  Objective:   Physical Exam BP 124/80   Pulse 76   Ht 5\' 2"  (1.575 m)   Wt 175 lb 3.2 oz (79.5 kg)   SpO2 97%   BMI 32.04 kg/m  Obese, NAD Eyes anicteric Lungs cta Cor NL ABd obese NTNo organomegaly or mass and bowel sounds are present Alert and oriented x3 Appropriate mood and affect  Data reviewed: Please see HPI ER visit and labs reviewed at that time normal CBC and chemistries.

## 2018-03-26 NOTE — Patient Instructions (Signed)
You have been scheduled for a colonoscopy. Please follow written instructions given to you at your visit today.  Please pick up your prep supplies at the pharmacy. If you use inhalers (even only as needed), please bring them with you on the day of your procedure. Your physician has requested that you go to www.startemmi.com and enter the access code given to you at your visit today. This web site gives a general overview about your procedure. However, you should still follow specific instructions given to you by our office regarding your preparation for the procedure.   Keep up the good work on your weight loss.   We are giving you a handout to read and follow on fatty liver.   I appreciate the opportunity to care for you. Silvano Rusk, MD, [facility]

## 2018-04-01 ENCOUNTER — Encounter: Payer: Self-pay | Admitting: Internal Medicine

## 2018-04-03 DIAGNOSIS — M5136 Other intervertebral disc degeneration, lumbar region: Secondary | ICD-10-CM | POA: Diagnosis not present

## 2018-04-06 DIAGNOSIS — M5136 Other intervertebral disc degeneration, lumbar region: Secondary | ICD-10-CM | POA: Diagnosis not present

## 2018-04-06 DIAGNOSIS — M5030 Other cervical disc degeneration, unspecified cervical region: Secondary | ICD-10-CM | POA: Diagnosis not present

## 2018-04-06 DIAGNOSIS — R200 Anesthesia of skin: Secondary | ICD-10-CM | POA: Diagnosis not present

## 2018-05-07 DIAGNOSIS — M5030 Other cervical disc degeneration, unspecified cervical region: Secondary | ICD-10-CM | POA: Diagnosis not present

## 2018-05-07 DIAGNOSIS — G5601 Carpal tunnel syndrome, right upper limb: Secondary | ICD-10-CM | POA: Diagnosis not present

## 2018-05-07 DIAGNOSIS — G5602 Carpal tunnel syndrome, left upper limb: Secondary | ICD-10-CM | POA: Diagnosis not present

## 2018-05-10 ENCOUNTER — Telehealth: Payer: Self-pay | Admitting: Internal Medicine

## 2018-05-10 NOTE — Telephone Encounter (Signed)
Patient notified ok to keep her appt.  She is asked to call back for any additional questions or concerns.

## 2018-05-16 ENCOUNTER — Telehealth: Payer: Self-pay | Admitting: Internal Medicine

## 2018-05-16 NOTE — Telephone Encounter (Signed)
Informed pt. That it was okay to take her diclofenac today that our procedure does not require her to stop taking her medication,she stated "I wanted to make sure it was ok to take today".

## 2018-05-17 ENCOUNTER — Encounter: Payer: Self-pay | Admitting: Internal Medicine

## 2018-05-17 ENCOUNTER — Ambulatory Visit (AMBULATORY_SURGERY_CENTER): Payer: Federal, State, Local not specified - PPO | Admitting: Internal Medicine

## 2018-05-17 VITALS — BP 112/65 | HR 73 | Temp 98.0°F | Resp 20 | Ht 62.0 in | Wt 175.0 lb

## 2018-05-17 DIAGNOSIS — D128 Benign neoplasm of rectum: Secondary | ICD-10-CM

## 2018-05-17 DIAGNOSIS — Z860100 Personal history of colon polyps, unspecified: Secondary | ICD-10-CM

## 2018-05-17 DIAGNOSIS — K621 Rectal polyp: Secondary | ICD-10-CM | POA: Diagnosis not present

## 2018-05-17 DIAGNOSIS — R159 Full incontinence of feces: Secondary | ICD-10-CM

## 2018-05-17 DIAGNOSIS — R32 Unspecified urinary incontinence: Secondary | ICD-10-CM

## 2018-05-17 HISTORY — DX: Unspecified urinary incontinence: R15.9

## 2018-05-17 MED ORDER — SODIUM CHLORIDE 0.9 % IV SOLN: 500.0000 mL | Freq: Once | INTRAVENOUS | Status: DC

## 2018-05-17 NOTE — Patient Instructions (Addendum)
I found and removed one small polyp from the rectum.  I will refer you to physical therapy as we discussed.  I will let you know pathology results and when to have another routine colonoscopy by mail and/or My Chart.  I appreciate the opportunity to care for you. Gatha Mayer, MD, FACG  YOU HAD AN ENDOSCOPIC PROCEDURE TODAY AT [facility]:   Refer to the procedure report that was given to you for any specific questions about what was found during the examination.  If the procedure report does not answer your questions, please call your gastroenterologist to clarify.  If you requested that your care partner not be given the details of your procedure findings, then the procedure report has been included in a sealed envelope for you to review at your convenience later.  YOU SHOULD EXPECT: Some feelings of bloating in the abdomen. Passage of more gas than usual.  Walking can help get rid of the air that was put into your GI tract during the procedure and reduce the bloating. If you had a lower endoscopy (such as a colonoscopy or flexible sigmoidoscopy) you may notice spotting of blood in your stool or on the toilet paper. If you underwent a bowel prep for your procedure, you may not have a normal bowel movement for a few days.  Please Note:  You might notice some irritation and congestion in your nose or some drainage.  This is from the oxygen used during your procedure.  There is no need for concern and it should clear up in a day or so.  SYMPTOMS TO REPORT IMMEDIATELY:   Following lower endoscopy (colonoscopy or flexible sigmoidoscopy):  Excessive amounts of blood in the stool  Significant tenderness or worsening of abdominal pains  Swelling of the abdomen that is new, acute  Fever of 100F or higher  For urgent or emergent issues, a gastroenterologist can be reached at any hour by calling [phone].   DIET:  We do recommend a small meal at first, but then  you may proceed to your regular diet.  Drink plenty of fluids but you should avoid alcoholic beverages for 24 hours.  MEDICATIONS: Continue present medications.  Please see handouts given to you by your recovery nurse.  ACTIVITY:  You should plan to take it easy for the rest of today and you should NOT DRIVE or use heavy machinery until tomorrow (because of the sedation medicines used during the test).    FOLLOW UP: Our staff will call the number listed on your records the next business day following your procedure to check on you and address any questions or concerns that you may have regarding the information given to you following your procedure. If we do not reach you, we will leave a message.  However, if you are feeling well and you are not experiencing any problems, there is no need to return our call.  We will assume that you have returned to your regular daily activities without incident.  If any biopsies were taken you will be contacted by phone or by letter within the next 1-3 weeks.  Please call us at [phone] if you have not heard about the biopsies in 3 weeks.   Thank you for allowing [place] to provide for your healthcare needs today.  SIGNATURES/CONFIDENTIALITY: You and/or your care partner have signed paperwork which will be entered into your electronic medical record.  These signatures attest to the fact that that the information  above on your After Visit Summary has been reviewed and is understood.  Full responsibility of the confidentiality of this discharge information lies with you and/or your care-partner. 

## 2018-05-17 NOTE — Op Note (Signed)
[facility] Patient Name: Candace Medina Procedure Date: 05/17/2018 10:49 AM MRN: [id number] Endoscopist: Gatha Mayer , MD Age: 62 Referring MD:  Date of Birth: 12-Nov-1955 Gender: Female Account #: [id number] Procedure:                Colonoscopy Indications:              Surveillance: Personal history of adenomatous                            polyps on last colonoscopy > 3 years ago Medicines:                Propofol per Anesthesia, Monitored Anesthesia Care Procedure:                Pre-Anesthesia Assessment:                           - Prior to the procedure, a History and Physical                            was performed, and patient medications and                            allergies were reviewed. The patient's tolerance of                            previous anesthesia was also reviewed. The risks                            and benefits of the procedure and the sedation                            options and risks were discussed with the patient.                            All questions were answered, and informed consent                            was obtained. Prior Anticoagulants: The patient has                            taken no previous anticoagulant or antiplatelet                            agents. ASA Grade Assessment: II - A patient with                            mild systemic disease. After reviewing the risks                            and benefits, the patient was deemed in                            satisfactory condition to undergo the procedure.  After obtaining informed consent, the colonoscope                            was passed under direct vision. Throughout the                            procedure, the patient's blood pressure, pulse, and                            oxygen saturations were monitored continuously. The                            Colonoscope was introduced through the anus and   advanced to the the cecum, identified by                            appendiceal orifice and ileocecal valve. The                            colonoscopy was performed without difficulty. The                            patient tolerated the procedure well. The quality                            of the bowel preparation was good. The ileocecal                            valve, appendiceal orifice, and rectum were                            photographed. The bowel preparation used was                            Miralax. Scope In: 10:58:12 AM Scope Out: 11:10:27 AM Scope Withdrawal Time: 0 hours 9 minutes 15 seconds  Total Procedure Duration: 0 hours 12 minutes 15 seconds  Findings:                 The perianal examination was normal.                           The digital rectal exam findings include decreased                            sphincter tone.                           A 2 mm polyp was found in the rectum. The polyp was                            sessile. The polyp was removed with a cold biopsy                            forceps. Resection and retrieval were complete.  Verification of patient identification for the                            specimen was done. Estimated blood loss was minimal.                           Scattered small-mouthed diverticula were found in                            the entire colon.                           The exam was otherwise without abnormality on                            direct and retroflexion views. Complications:            No immediate complications. Estimated Blood Loss:     Estimated blood loss was minimal. Impression:               - Decreased sphincter tone found on digital rectal                            exam.                           - One 2 mm polyp in the rectum, removed with a cold                            biopsy forceps. Resected and retrieved.                           - Diverticulosis in the  entire examined colon.                           - The examination was otherwise normal on direct                            and retroflexion views.                           - Personal history of colonic polyps - adenomas                            2016. Recommendation:           - Patient has a contact number available for                            emergencies. The signs and symptoms of potential                            delayed complications were discussed with the                            patient. Return to normal activities tomorrow.  Written discharge instructions were provided to the                            patient.                           - Resume previous diet.                           - Continue present medications.                           - Await pathology results.                           - Repeat colonoscopy is recommended for                            surveillance. The colonoscopy date will be                            determined after pathology results from today's                            exam become available for review.                           My office will refer to pelvic PT to treat urinary                            and fecal incontinence - decreased voluntary anal                            squeeze noted. Gatha Mayer, MD 05/17/2018 11:19:36 AM This report has been signed electronically.

## 2018-05-17 NOTE — Progress Notes (Signed)
Report given to PACU, vss 

## 2018-05-17 NOTE — Progress Notes (Signed)
Called to room to assist during endoscopic procedure.  Patient ID and intended procedure confirmed with present staff. Received instructions for my participation in the procedure from the performing physician.  

## 2018-05-18 ENCOUNTER — Telehealth: Payer: Self-pay

## 2018-05-18 ENCOUNTER — Telehealth: Payer: Self-pay | Admitting: *Deleted

## 2018-05-18 DIAGNOSIS — R32 Unspecified urinary incontinence: Secondary | ICD-10-CM

## 2018-05-18 DIAGNOSIS — R159 Full incontinence of feces: Secondary | ICD-10-CM

## 2018-05-18 NOTE — Telephone Encounter (Signed)
-----   Message from Gatha Mayer, MD sent at 05/17/2018 11:20 AM EDT ----- Regarding: referral to PT Please refer to pelvic PT Re: urinary + fecal incontinence

## 2018-05-18 NOTE — Telephone Encounter (Signed)
  Follow up Call-  Call back number 05/17/2018  Post procedure Call Back phone  # [phone]  Permission to leave phone message Yes  Some recent data might be hidden     Patient questions:  Do you have a fever, pain , or abdominal swelling? No. Pain Score  0 *  Have you tolerated food without any problems? Yes.    Have you been able to return to your normal activities? Yes.    Do you have any questions about your discharge instructions: Diet   No. Medications  No. Follow up visit  No.  Do you have questions or concerns about your Care? No.  Actions: * If pain score is 4 or above: No action needed, pain <4.

## 2018-05-18 NOTE — Telephone Encounter (Signed)
Patient notified of the recommendations and that she will be contacted directly by the PT department to schedule.

## 2018-05-31 ENCOUNTER — Encounter: Payer: Self-pay | Admitting: Internal Medicine

## 2018-05-31 DIAGNOSIS — M5136 Other intervertebral disc degeneration, lumbar region: Secondary | ICD-10-CM | POA: Diagnosis not present

## 2018-05-31 DIAGNOSIS — M5030 Other cervical disc degeneration, unspecified cervical region: Secondary | ICD-10-CM | POA: Diagnosis not present

## 2018-05-31 DIAGNOSIS — Z86010 Personal history of colonic polyps: Secondary | ICD-10-CM

## 2018-05-31 NOTE — Progress Notes (Signed)
Diminutive rectal polyp was hyperplastic Hx larger right serrated polyp(s) Recall 2024

## 2018-06-27 DIAGNOSIS — M25512 Pain in left shoulder: Secondary | ICD-10-CM | POA: Diagnosis not present

## 2018-06-27 DIAGNOSIS — M13812 Other specified arthritis, left shoulder: Secondary | ICD-10-CM | POA: Diagnosis not present

## 2018-07-04 DIAGNOSIS — K080 Exfoliation of teeth due to systemic causes: Secondary | ICD-10-CM | POA: Diagnosis not present

## 2018-07-12 DIAGNOSIS — M47816 Spondylosis without myelopathy or radiculopathy, lumbar region: Secondary | ICD-10-CM | POA: Diagnosis not present

## 2018-07-17 DIAGNOSIS — G5601 Carpal tunnel syndrome, right upper limb: Secondary | ICD-10-CM | POA: Diagnosis not present

## 2018-07-17 DIAGNOSIS — G5602 Carpal tunnel syndrome, left upper limb: Secondary | ICD-10-CM | POA: Diagnosis not present

## 2018-07-31 DIAGNOSIS — M2550 Pain in unspecified joint: Secondary | ICD-10-CM | POA: Diagnosis not present

## 2018-07-31 DIAGNOSIS — M545 Low back pain: Secondary | ICD-10-CM | POA: Diagnosis not present

## 2018-07-31 DIAGNOSIS — M450 Ankylosing spondylitis of multiple sites in spine: Secondary | ICD-10-CM | POA: Diagnosis not present

## 2018-07-31 DIAGNOSIS — M150 Primary generalized (osteo)arthritis: Secondary | ICD-10-CM | POA: Diagnosis not present

## 2018-08-02 DIAGNOSIS — M47816 Spondylosis without myelopathy or radiculopathy, lumbar region: Secondary | ICD-10-CM | POA: Diagnosis not present

## 2018-08-14 DIAGNOSIS — G5601 Carpal tunnel syndrome, right upper limb: Secondary | ICD-10-CM | POA: Diagnosis not present

## 2018-08-14 DIAGNOSIS — G5602 Carpal tunnel syndrome, left upper limb: Secondary | ICD-10-CM | POA: Diagnosis not present

## 2018-08-27 ENCOUNTER — Other Ambulatory Visit: Payer: Self-pay

## 2018-08-27 ENCOUNTER — Emergency Department
Admission: EM | Admit: 2018-08-27 | Discharge: 2018-08-27 | Disposition: A | Discharge status: Home / Self Care | Payer: Federal, State, Local not specified - PPO | Source: Home / Self Care | Attending: Family Medicine | Admitting: Family Medicine

## 2018-08-27 DIAGNOSIS — S61419A Laceration without foreign body of unspecified hand, initial encounter: Secondary | ICD-10-CM | POA: Diagnosis not present

## 2018-08-27 DIAGNOSIS — S60511A Abrasion of right hand, initial encounter: Secondary | ICD-10-CM

## 2018-08-27 DIAGNOSIS — Z23 Encounter for immunization: Secondary | ICD-10-CM

## 2018-08-27 MED ORDER — DOXYCYCLINE HYCLATE 100 MG PO CAPS: 100.0000 mg | ORAL_CAPSULE | Freq: Two times a day (BID) | ORAL | 0 refills | Status: DC

## 2018-08-27 MED ORDER — INFLUENZA VAC SPLIT QUAD 0.5 ML IM SUSY
0.5000 mL | PREFILLED_SYRINGE | INTRAMUSCULAR | Status: AC
  Administered 2018-08-27: 0.5 mL via INTRAMUSCULAR

## 2018-08-27 MED ORDER — TETANUS-DIPHTH-ACELL PERTUSSIS 5-2.5-18.5 LF-MCG/0.5 IM SUSP
0.5000 mL | Freq: Once | INTRAMUSCULAR | Status: AC
  Administered 2018-08-27: 0.5 mL via INTRAMUSCULAR

## 2018-08-27 NOTE — Discharge Instructions (Addendum)
Keep wound clean and dry.  Return for any signs of infection (or follow-up with family doctor):  Increasing redness, swelling, pain, heat, drainage, etc. °Follow instructions on Dermabond information sheet.  °

## 2018-08-27 NOTE — ED Provider Notes (Signed)
[facility]    CSN: [id number] Arrival date & time: 08/27/18  1322     History   Chief Complaint Chief Complaint  Patient presents with  . Hand Injury    HPI Candace Medina is a 62 y.o. female.   Patient scraped the dorsum of her right hand on a dog kennel last night.  She applied H2O2 and non-stick gauze.  She does not remember her last Tdap.  The history is provided by the patient.  Hand Injury  Location:  Hand Hand location:  Dorsum of R hand Injury: yes   Time since incident:  14 hours Mechanism of injury comment:  Scraped on metal Pain details:    Quality:  Aching   Radiates to:  Does not radiate   Severity:  Mild   Onset quality:  Sudden   Duration:  14 hours   Timing:  Constant   Progression:  Unchanged Handedness:  Left-handed Foreign body present:  No foreign bodies Tetanus status:  Unknown Prior injury to area:  No Relieved by:  Nothing Worsened by:  Movement Associated symptoms: no decreased range of motion, no numbness, no stiffness, no swelling and no tingling     Past Medical History:  Diagnosis Date  . Ankylosing spondylitis ([place])   . Anxiety and depression   . Arthritis   . Asthma   . Colon polyps   . COPD (chronic obstructive pulmonary disease) ([place])   . Diabetes mellitus without complication ([place])   . GERD (gastroesophageal reflux disease)   . Hx of colonic polyps 12/30/2014  . Hyperlipidemia   . Internal hemorrhoids with prolapse - Gr 2 12/17/2014  . Migraines   . Nicotine addiction   . Osteopenia   . Plantar fasciitis   . Urinary and fecal incontinence 05/17/2018  . Vitamin D deficiency     Patient Active Problem List   Diagnosis Date Noted  . Urinary and fecal incontinence 05/17/2018  . Thrombosed external hemorrhoid 01/22/2015  . Hx of colonic polyps 12/30/2014  . Internal hemorrhoids with prolapse - Gr 2 12/17/2014  . Abdominal pain, RUQ 12/17/2014    Past Surgical History:  Procedure Laterality Date  .  COLONOSCOPY    . HEMORRHOID BANDING  02/2015  . LAPAROSCOPIC CHOLECYSTECTOMY  09/2017   Dr. Georgette Dover  . SHOULDER ARTHROSCOPY Right    bursitis  . SHOULDER SURGERY Left   . TONSILLECTOMY    . TOTAL ABDOMINAL HYSTERECTOMY  1986  . UPPER GASTROINTESTINAL ENDOSCOPY      OB History   No obstetric history on file.      Home Medications    Prior to Admission medications   Medication Sig Start Date End Date Taking? Authorizing Provider  albuterol (PROVENTIL HFA;VENTOLIN HFA) 108 (90 BASE) MCG/ACT inhaler Inhale into the lungs every 6 (six) hours as needed for wheezing or shortness of breath.    [provider]  ALPRAZolam Duanne Moron) 0.5 MG tablet Take 0.5 mg by mouth at bedtime as needed for anxiety or sleep.     [provider]  budesonide-formoterol (SYMBICORT) 80-4.5 MCG/ACT inhaler Inhale 2 puffs into the lungs 2 (two) times daily.    [provider]  CHANTIX 1 MG tablet Take 1 mg by mouth as directed. 11/18/17   [provider]  diclofenac (VOLTAREN) 75 MG EC tablet Take 75 mg by mouth 2 (two) times daily.    [provider]  doxycycline (VIBRAMYCIN) 100 MG capsule Take 1 capsule (100 mg total) by mouth  2 (two) times daily. Take with food. 08/27/18   Kandra Nicolas, MD  EPINEPHrine 0.3 mg/0.3 mL IJ SOAJ injection Inject 0.3 mg into the muscle as needed for anaphylaxis. 10/24/17   [provider]  ergocalciferol (VITAMIN D2) 50000 UNITS capsule Take 50,000 Units by mouth 2 (two) times a week.     [provider]  fluticasone (FLONASE) 50 MCG/ACT nasal spray Place 1 spray into the nose daily as needed for allergies or rhinitis.     [provider]  metFORMIN (GLUCOPHAGE) 1000 MG tablet Take 1,000 mg by mouth every evening.     [provider]  omeprazole (PRILOSEC) 20 MG capsule Take 1 capsule by mouth daily. 08/18/14   [provider]  predniSONE (DELTASONE) 5 MG tablet prednisone 5 mg tablet    [provider]  PREVIDENT 5000 DRY MOUTH 1.1 % GEL dental gel daily. 08/18/14   [provider]  simvastatin (ZOCOR) 20 MG tablet Take 20 mg by mouth daily.    [provider]  traMADol (ULTRAM) 50 MG tablet Take by mouth every 6 (six) hours as needed.    [provider]  varenicline (CHANTIX) 0.5 MG tablet Chantix Starting Month Box 0.5 mg (11)-1 mg (42) tablets in dose pack    [provider]  XIIDRA 5 % SOLN Place 1 drop into both eyes 2 (two) times daily. 12/19/17   [provider]    Family History Family History  Problem Relation Age of Onset  . Bladder Cancer Father   . Esophageal cancer Mother   . Melanoma Brother   . Heart disease Other        mother's entire family-13 Bro and Sis  . Brain cancer Paternal Aunt   . Lung cancer Paternal Uncle   . Bone cancer Paternal Uncle   . Lung cancer Paternal Aunt   . Colon cancer Neg Hx     Social History Social History   Tobacco Use  . Smoking status: Current Every Day Smoker    Packs/day: 1.00    Years: 43.00    Pack years: 43.00    Types: Cigarettes  . Smokeless tobacco: Never Used  Substance Use Topics  . Alcohol use: No    Alcohol/week: 0.0 standard drinks  . Drug use: No     Allergies   Venomil honey bee venom [honey bee venom]; Boniva [ibandronic acid]; Keflex [cephalexin]; Neosporin [neomycin-bacitracin zn-polymyx]; Tramadol; Codeine; and Morphine and related   Review of Systems Review of Systems  Musculoskeletal: Negative for stiffness.  All other systems reviewed and are negative.    Physical Exam Triage Vital Signs ED Triage Vitals  Enc Vitals Group     BP 08/27/18 1403 (!) 157/111     Pulse --      Resp 08/27/18 1403 20     Temp 08/27/18 1403 98.2 F (36.8 C)     Temp Source 08/27/18 1403 Oral     SpO2 08/27/18 1403 97 %     Weight 08/27/18 1404 174 lb (78.9 kg)     Height 08/27/18 1404 5\' 2"  (1.575 m)     Head Circumference --      Peak Flow --       Pain Score 08/27/18 1403 0     Pain Loc --      Pain Edu? --      Excl. in [place]? --    No data found.  Updated Vital Signs BP (!) 157/111 (BP Location:  Right Arm)   Temp 98.2 F (36.8 C) (Oral)   Resp 20   Ht 5\' 2"  (1.575 m)   Wt 78.9 kg   SpO2 97%   BMI 31.83 kg/m   Visual Acuity Right Eye Distance:   Left Eye Distance:   Bilateral Distance:    Right Eye Near:   Left Eye Near:    Bilateral Near:     Physical Exam Vitals signs and nursing note reviewed.  Constitutional:      General: She is not in acute distress.    Appearance: Normal appearance. She is not ill-appearing.  Musculoskeletal:       Hands:     Comments: Dorsum of right hand has a 30mm wide by 4cm long superficial abrasion with partial skin flaps remaining.  Neurological:     Mental Status: She is alert.      UC Treatments / Results  Labs (all labs ordered are listed, but only abnormal results are displayed) Labs Reviewed - No data to display  EKG None  Radiology No results found.  Procedures Procedures  Laceration Repair (Dermabond) Discussed benefits and risks of procedure and verbal consent obtained. Using sterile technique, cleansed wound with copious lavage with normal saline.  Wound carefully inspected for debris and foreign bodies; none found.  Remaining wound skin flaps carefully approximated in normal anatomic position and secured with narrow 16mm wide SteriStrips.  Abrasion and skin flaps coated with Dermabond.  Wound precautions explained to patient.     Medications Ordered in UC Medications - No data to display  Initial Impression / Assessment and Plan / UC Course  I have reviewed the triage vital signs and the nursing notes.  Pertinent labs & imaging results that were available during my care of the patient were reviewed by me and considered in my medical decision making (see chart for details).    Begin empiric doxycycline.   Administered Tdap. Administered influenza vaccine  at patient's request.   Final Clinical Impressions(s) / UC Diagnoses   Final diagnoses:  Abrasion of right hand, initial encounter  Skin tear of hand without complication, initial encounter     Discharge Instructions     Keep wound clean and dry.  Return for any signs of infection (or follow-up with family doctor):  Increasing redness, swelling, pain, heat, drainage, etc. Follow instructions on Dermabond information sheet.     ED Prescriptions    Medication Sig Dispense Auth. Provider   doxycycline (VIBRAMYCIN) 100 MG capsule Take 1 capsule (100 mg total) by mouth 2 (two) times daily. Take with food. 14 capsule Kandra Nicolas, MD         Kandra Nicolas, MD 08/27/18 1500

## 2018-08-27 NOTE — ED Triage Notes (Signed)
Mrs. Marquis came in to clinic due to an injury in R hand secondary to attempting to remove her large dog out of a crate (he was trying to get to another large dog). Her hand was scraped. The wound was exposed and cleansed with sterile water and Hibiclens by this RN. The wound measures approximately 4.5cm and has subcutaneous tissue exposed, pink, and yellow with small amount of dark blood drainage. 2 small areas with hematoma.

## 2018-08-31 ENCOUNTER — Emergency Department (INDEPENDENT_AMBULATORY_CARE_PROVIDER_SITE_OTHER)
Admission: EM | Admit: 2018-08-31 | Discharge: 2018-08-31 | Disposition: A | Discharge status: Home / Self Care | Payer: Federal, State, Local not specified - PPO | Source: Home / Self Care

## 2018-08-31 ENCOUNTER — Encounter: Payer: Self-pay | Admitting: Emergency Medicine

## 2018-08-31 ENCOUNTER — Other Ambulatory Visit: Payer: Self-pay

## 2018-08-31 DIAGNOSIS — L089 Local infection of the skin and subcutaneous tissue, unspecified: Secondary | ICD-10-CM

## 2018-08-31 DIAGNOSIS — Z5189 Encounter for other specified aftercare: Secondary | ICD-10-CM | POA: Diagnosis not present

## 2018-08-31 DIAGNOSIS — T148XXA Other injury of unspecified body region, initial encounter: Secondary | ICD-10-CM

## 2018-08-31 MED ORDER — DOXYCYCLINE HYCLATE 100 MG PO CAPS: 100.0000 mg | ORAL_CAPSULE | Freq: Two times a day (BID) | ORAL | 0 refills | Status: DC

## 2018-08-31 NOTE — Discharge Instructions (Addendum)
Watch for any sign of infection

## 2018-08-31 NOTE — ED Triage Notes (Signed)
Top of R hand injured 5 days ago, she is concerned that it is infected. She is taking doxycycline. Wound appears to be healing well to me.

## 2018-09-01 NOTE — ED Provider Notes (Signed)
[facility]    CSN: [id number] Arrival date & time: 08/31/18  1606     History   Chief Complaint Chief Complaint  Patient presents with  . Wound Check    HPI AIRA SALLADE is a 62 y.o. female.   The history is provided by the patient. No language interpreter was used.  Wound Check  This is a new problem. Episode onset: 5 days ago. The problem occurs constantly. The problem has been gradually worsening. Nothing aggravates the symptoms. Nothing relieves the symptoms. She has tried nothing for the symptoms. The treatment provided no relief.  Pt reports she saw some clear drainage  Past Medical History:  Diagnosis Date  . Ankylosing spondylitis ([place])   . Anxiety and depression   . Arthritis   . Asthma   . Colon polyps   . COPD (chronic obstructive pulmonary disease) ([place])   . Diabetes mellitus without complication ([place])   . GERD (gastroesophageal reflux disease)   . Hx of colonic polyps 12/30/2014  . Hyperlipidemia   . Internal hemorrhoids with prolapse - Gr 2 12/17/2014  . Migraines   . Nicotine addiction   . Osteopenia   . Plantar fasciitis   . Urinary and fecal incontinence 05/17/2018  . Vitamin D deficiency     Patient Active Problem List   Diagnosis Date Noted  . Urinary and fecal incontinence 05/17/2018  . Thrombosed external hemorrhoid 01/22/2015  . Hx of colonic polyps 12/30/2014  . Internal hemorrhoids with prolapse - Gr 2 12/17/2014  . Abdominal pain, RUQ 12/17/2014    Past Surgical History:  Procedure Laterality Date  . COLONOSCOPY    . HEMORRHOID BANDING  02/2015  . LAPAROSCOPIC CHOLECYSTECTOMY  09/2017   Dr. Georgette Dover  . SHOULDER ARTHROSCOPY Right    bursitis  . SHOULDER SURGERY Left   . TONSILLECTOMY    . TOTAL ABDOMINAL HYSTERECTOMY  1986  . UPPER GASTROINTESTINAL ENDOSCOPY      OB History   No obstetric history on file.      Home Medications    Prior to Admission medications   Medication Sig Start Date End Date Taking?  Authorizing Provider  albuterol (PROVENTIL HFA;VENTOLIN HFA) 108 (90 BASE) MCG/ACT inhaler Inhale into the lungs every 6 (six) hours as needed for wheezing or shortness of breath.    [provider]  ALPRAZolam Duanne Moron) 0.5 MG tablet Take 0.5 mg by mouth at bedtime as needed for anxiety or sleep.     [provider]  budesonide-formoterol (SYMBICORT) 80-4.5 MCG/ACT inhaler Inhale 2 puffs into the lungs 2 (two) times daily.    [provider]  CHANTIX 1 MG tablet Take 1 mg by mouth as directed. 11/18/17   [provider]  diclofenac (VOLTAREN) 75 MG EC tablet Take 75 mg by mouth 2 (two) times daily.    [provider]  doxycycline (VIBRAMYCIN) 100 MG capsule Take 1 capsule (100 mg total) by mouth 2 (two) times daily. 08/31/18   Fransico Meadow, PA-C  EPINEPHrine 0.3 mg/0.3 mL IJ SOAJ injection Inject 0.3 mg into the muscle as needed for anaphylaxis. 10/24/17   [provider]  ergocalciferol (VITAMIN D2) 50000 UNITS capsule Take 50,000 Units by mouth 2 (two) times a week.     [provider]  fluticasone (FLONASE) 50 MCG/ACT nasal spray Place 1 spray into the nose daily as needed for allergies or rhinitis.     [provider]  metFORMIN (GLUCOPHAGE) 1000 MG tablet Take 1,000 mg  by mouth every evening.     [provider]  omeprazole (PRILOSEC) 20 MG capsule Take 1 capsule by mouth daily. 08/18/14   [provider]  predniSONE (DELTASONE) 5 MG tablet prednisone 5 mg tablet    [provider]  PREVIDENT 5000 DRY MOUTH 1.1 % GEL dental gel daily. 08/18/14   [provider]  simvastatin (ZOCOR) 20 MG tablet Take 20 mg by mouth daily.    [provider]  traMADol (ULTRAM) 50 MG tablet Take by mouth every 6 (six) hours as needed.    [provider]  varenicline (CHANTIX) 0.5 MG tablet Chantix Starting Month Box 0.5 mg (11)-1 mg (42) tablets in dose pack    [provider]  XIIDRA  5 % SOLN Place 1 drop into both eyes 2 (two) times daily. 12/19/17   [provider]    Family History Family History  Problem Relation Age of Onset  . Bladder Cancer Father   . Esophageal cancer Mother   . Melanoma Brother   . Heart disease Other        mother's entire family-13 Bro and Sis  . Brain cancer Paternal Aunt   . Lung cancer Paternal Uncle   . Bone cancer Paternal Uncle   . Lung cancer Paternal Aunt   . Colon cancer Neg Hx     Social History Social History   Tobacco Use  . Smoking status: Current Every Day Smoker    Packs/day: 1.00    Years: 43.00    Pack years: 43.00    Types: Cigarettes  . Smokeless tobacco: Never Used  Substance Use Topics  . Alcohol use: No    Alcohol/week: 0.0 standard drinks  . Drug use: No     Allergies   Venomil honey bee venom [honey bee venom]; Boniva [ibandronic acid]; Keflex [cephalexin]; Neosporin [neomycin-bacitracin zn-polymyx]; Tramadol; Codeine; and Morphine and related   Review of Systems Review of Systems  All other systems reviewed and are negative.    Physical Exam Triage Vital Signs ED Triage Vitals  Enc Vitals Group     BP 08/31/18 1645 122/81     Pulse Rate 08/31/18 1645 90     Resp --      Temp 08/31/18 1645 97.9 F (36.6 C)     Temp Source 08/31/18 1645 Oral     SpO2 08/31/18 1645 96 %     Weight 08/31/18 1646 178 lb (80.7 kg)     Height 08/31/18 1646 5\' 2"  (1.575 m)     Head Circumference --      Peak Flow --      Pain Score 08/31/18 1646 0     Pain Loc --      Pain Edu? --      Excl. in [place]? --    No data found.  Updated Vital Signs BP 122/81 (BP Location: Right Arm)   Pulse 90   Temp 97.9 F (36.6 C) (Oral)   Ht 5\' 2"  (1.575 m)   Wt 178 lb (80.7 kg)   SpO2 96%   BMI 32.56 kg/m   Visual Acuity Right Eye Distance:   Left Eye Distance:   Bilateral Distance:    Right Eye Near:   Left Eye Near:    Bilateral Near:     Physical Exam Vitals signs and nursing note reviewed.    Constitutional:      Appearance: Normal appearance.  HENT:     Head: Normocephalic.  Cardiovascular:  Rate and Rhythm: Normal rate.  Pulmonary:     Effort: Pulmonary effort is normal.  Musculoskeletal:        General: No swelling.     Comments: Healing wound  Yellow oozing at base of wound.  I removed stristrips and dermabond.  Wound cleaned looks goog  Skin:    General: Skin is warm.  Neurological:     General: No focal deficit present.     Mental Status: She is alert.  Psychiatric:        Mood and Affect: Mood normal.      UC Treatments / Results  Labs (all labs ordered are listed, but only abnormal results are displayed) Labs Reviewed - No data to display  EKG None  Radiology No results found.  Procedures Procedures (including critical care time)  Medications Ordered in UC Medications - No data to display  Initial Impression / Assessment and Plan / UC Course  I have reviewed the triage vital signs and the nursing notes.  Pertinent labs & imaging results that were available during my care of the patient were reviewed by me and considered in my medical decision making (see chart for details).     MDM  Pt counseled on wound care Final Clinical Impressions(s) / UC Diagnoses   Final diagnoses:  Visit for wound check  Wound infection     Discharge Instructions     Watch for any sign of infection.    ED Prescriptions    Medication Sig Dispense Auth. Provider   doxycycline (VIBRAMYCIN) 100 MG capsule Take 1 capsule (100 mg total) by mouth 2 (two) times daily. 10 capsule Fransico Meadow, [place]     Controlled Substance Prescriptions [place] Controlled Substance Registry consulted? Not Applicable  An After Visit Summary was printed and given to the patient.    Fransico Meadow, [place] 09/01/18 [phone]

## 2018-09-03 DIAGNOSIS — H04123 Dry eye syndrome of bilateral lacrimal glands: Secondary | ICD-10-CM | POA: Diagnosis not present

## 2018-09-03 DIAGNOSIS — E119 Type 2 diabetes mellitus without complications: Secondary | ICD-10-CM | POA: Diagnosis not present

## 2018-09-03 DIAGNOSIS — H2513 Age-related nuclear cataract, bilateral: Secondary | ICD-10-CM | POA: Diagnosis not present

## 2018-09-07 DIAGNOSIS — Z0000 Encounter for general adult medical examination without abnormal findings: Secondary | ICD-10-CM | POA: Diagnosis not present

## 2018-09-07 DIAGNOSIS — E7849 Other hyperlipidemia: Secondary | ICD-10-CM | POA: Diagnosis not present

## 2018-09-07 DIAGNOSIS — R82998 Other abnormal findings in urine: Secondary | ICD-10-CM | POA: Diagnosis not present

## 2018-09-07 DIAGNOSIS — E119 Type 2 diabetes mellitus without complications: Secondary | ICD-10-CM | POA: Diagnosis not present

## 2018-09-07 DIAGNOSIS — E559 Vitamin D deficiency, unspecified: Secondary | ICD-10-CM | POA: Diagnosis not present

## 2018-09-13 ENCOUNTER — Other Ambulatory Visit: Payer: Self-pay | Admitting: Internal Medicine

## 2018-09-13 DIAGNOSIS — E7849 Other hyperlipidemia: Secondary | ICD-10-CM | POA: Diagnosis not present

## 2018-09-13 DIAGNOSIS — F17209 Nicotine dependence, unspecified, with unspecified nicotine-induced disorders: Secondary | ICD-10-CM

## 2018-09-13 DIAGNOSIS — Z1389 Encounter for screening for other disorder: Secondary | ICD-10-CM | POA: Diagnosis not present

## 2018-09-13 DIAGNOSIS — I2510 Atherosclerotic heart disease of native coronary artery without angina pectoris: Secondary | ICD-10-CM | POA: Diagnosis not present

## 2018-09-13 DIAGNOSIS — J449 Chronic obstructive pulmonary disease, unspecified: Secondary | ICD-10-CM | POA: Diagnosis not present

## 2018-09-13 DIAGNOSIS — Z0000 Encounter for general adult medical examination without abnormal findings: Secondary | ICD-10-CM | POA: Diagnosis not present

## 2018-09-13 DIAGNOSIS — E119 Type 2 diabetes mellitus without complications: Secondary | ICD-10-CM | POA: Diagnosis not present

## 2018-09-13 DIAGNOSIS — F17200 Nicotine dependence, unspecified, uncomplicated: Secondary | ICD-10-CM | POA: Diagnosis not present

## 2018-09-14 ENCOUNTER — Ambulatory Visit
Admission: RE | Admit: 2018-09-14 | Discharge: 2018-09-14 | Disposition: A | Discharge status: Home / Self Care | Payer: Federal, State, Local not specified - PPO | Source: Ambulatory Visit | Attending: Internal Medicine | Admitting: Internal Medicine

## 2018-09-14 DIAGNOSIS — F17210 Nicotine dependence, cigarettes, uncomplicated: Secondary | ICD-10-CM | POA: Diagnosis not present

## 2018-09-14 DIAGNOSIS — F329 Major depressive disorder, single episode, unspecified: Secondary | ICD-10-CM | POA: Diagnosis not present

## 2018-09-14 DIAGNOSIS — F17209 Nicotine dependence, unspecified, with unspecified nicotine-induced disorders: Secondary | ICD-10-CM

## 2018-09-25 DIAGNOSIS — D0461 Carcinoma in situ of skin of right upper limb, including shoulder: Secondary | ICD-10-CM | POA: Diagnosis not present

## 2018-09-25 DIAGNOSIS — L858 Other specified epidermal thickening: Secondary | ICD-10-CM | POA: Diagnosis not present

## 2018-10-01 ENCOUNTER — Telehealth: Payer: Self-pay | Admitting: Acute Care

## 2018-10-01 DIAGNOSIS — F17210 Nicotine dependence, cigarettes, uncomplicated: Secondary | ICD-10-CM

## 2018-10-01 DIAGNOSIS — Z122 Encounter for screening for malignant neoplasm of respiratory organs: Secondary | ICD-10-CM

## 2018-10-01 NOTE — Telephone Encounter (Signed)
Denise>> Please call patient and make sure these results were called to her. This scan was ordered by Dr. Brigitte Pulse. This is actually one of our patient's, but because he ordered the scan, it was sent to him. I did not get the result. Please call Dr. Brigitte Pulse, and ask him if he wants to take full responsibility for this patient's annual screening scan and follow up, or if he would like [place] to continue screening the patient.   Rodena Piety,,  thanks so much for the heads up on this. Can we call [facility] and make sure any patient that we have been following ( ie: has a previous scan ordered through the program) has the results sent to me even if another doctor orders the scan.Thanks so much.    Lung-RADS 2S, benign appearance or behavior. Continue annual screening with low-dose chest CT without contrast in 12 months. 2. The "S" modifier above refers to potentially clinically significant non lung cancer related findings. Specifically, there is aortic atherosclerosis, in addition to 2 vessel coronary artery disease. Please note that although the presence of coronary artery calcium documents the presence of coronary artery disease, the severity of this disease and any potential stenosis cannot be assessed on this non-gated CT examination. Assessment for potential risk factor modification, dietary therapy or pharmacologic therapy may be warranted, if clinically indicated. 3. Mild diffuse bronchial wall thickening with mild to moderate centrilobular and paraseptal emphysema; imaging findings suggestive of underlying COPD.  Aortic Atherosclerosis (ICD10-I70.0) and Emphysema (ICD10-J43.9).

## 2018-10-02 DIAGNOSIS — M810 Age-related osteoporosis without current pathological fracture: Secondary | ICD-10-CM | POA: Diagnosis not present

## 2018-10-02 NOTE — Telephone Encounter (Signed)
Spoke with pt and gave results of recent low dose chest CT.  PT verbalized understanding.  Order placed for 1 yr f/u Chest CT.   Spoke with Fabby at [facility] and asked that they use the order that we place for CT scan if they see that we have been following the pt yearly for lung screening.  She asked that we put a note in each order to please use our order for yearly screenings. She is also going to talk to her supervisor to see if she has anymore suggestions. Nothing further needed at this time.

## 2018-10-16 DIAGNOSIS — F329 Major depressive disorder, single episode, unspecified: Secondary | ICD-10-CM | POA: Diagnosis not present

## 2018-10-19 DIAGNOSIS — D0461 Carcinoma in situ of skin of right upper limb, including shoulder: Secondary | ICD-10-CM | POA: Diagnosis not present

## 2018-10-31 DIAGNOSIS — M545 Low back pain: Secondary | ICD-10-CM | POA: Diagnosis not present

## 2018-10-31 DIAGNOSIS — M150 Primary generalized (osteo)arthritis: Secondary | ICD-10-CM | POA: Diagnosis not present

## 2018-10-31 DIAGNOSIS — M2550 Pain in unspecified joint: Secondary | ICD-10-CM | POA: Diagnosis not present

## 2018-10-31 DIAGNOSIS — M450 Ankylosing spondylitis of multiple sites in spine: Secondary | ICD-10-CM | POA: Diagnosis not present

## 2018-11-13 DIAGNOSIS — F329 Major depressive disorder, single episode, unspecified: Secondary | ICD-10-CM | POA: Diagnosis not present

## 2018-11-15 DIAGNOSIS — M810 Age-related osteoporosis without current pathological fracture: Secondary | ICD-10-CM | POA: Diagnosis not present

## 2018-12-17 DIAGNOSIS — F329 Major depressive disorder, single episode, unspecified: Secondary | ICD-10-CM | POA: Diagnosis not present

## 2018-12-28 DIAGNOSIS — F329 Major depressive disorder, single episode, unspecified: Secondary | ICD-10-CM | POA: Diagnosis not present

## 2018-12-28 DIAGNOSIS — G2581 Restless legs syndrome: Secondary | ICD-10-CM | POA: Diagnosis not present

## 2018-12-28 DIAGNOSIS — F418 Other specified anxiety disorders: Secondary | ICD-10-CM | POA: Diagnosis not present

## 2018-12-31 DIAGNOSIS — R7989 Other specified abnormal findings of blood chemistry: Secondary | ICD-10-CM | POA: Diagnosis not present

## 2018-12-31 DIAGNOSIS — Z79899 Other long term (current) drug therapy: Secondary | ICD-10-CM | POA: Diagnosis not present

## 2019-01-08 DIAGNOSIS — F418 Other specified anxiety disorders: Secondary | ICD-10-CM | POA: Diagnosis not present

## 2019-01-08 DIAGNOSIS — F329 Major depressive disorder, single episode, unspecified: Secondary | ICD-10-CM | POA: Diagnosis not present

## 2019-01-08 DIAGNOSIS — M810 Age-related osteoporosis without current pathological fracture: Secondary | ICD-10-CM | POA: Diagnosis not present

## 2019-01-10 DIAGNOSIS — M545 Low back pain: Secondary | ICD-10-CM | POA: Diagnosis not present

## 2019-01-10 DIAGNOSIS — M150 Primary generalized (osteo)arthritis: Secondary | ICD-10-CM | POA: Diagnosis not present

## 2019-01-10 DIAGNOSIS — M450 Ankylosing spondylitis of multiple sites in spine: Secondary | ICD-10-CM | POA: Diagnosis not present

## 2019-01-10 DIAGNOSIS — M2550 Pain in unspecified joint: Secondary | ICD-10-CM | POA: Diagnosis not present

## 2019-01-25 DIAGNOSIS — G2581 Restless legs syndrome: Secondary | ICD-10-CM | POA: Diagnosis not present

## 2019-01-25 DIAGNOSIS — E119 Type 2 diabetes mellitus without complications: Secondary | ICD-10-CM | POA: Diagnosis not present

## 2019-01-25 DIAGNOSIS — F418 Other specified anxiety disorders: Secondary | ICD-10-CM | POA: Diagnosis not present

## 2019-01-28 DIAGNOSIS — H5053 Vertical heterophoria: Secondary | ICD-10-CM | POA: Diagnosis not present

## 2019-01-28 DIAGNOSIS — E119 Type 2 diabetes mellitus without complications: Secondary | ICD-10-CM | POA: Diagnosis not present

## 2019-01-28 DIAGNOSIS — H04123 Dry eye syndrome of bilateral lacrimal glands: Secondary | ICD-10-CM | POA: Diagnosis not present

## 2019-01-28 DIAGNOSIS — H2513 Age-related nuclear cataract, bilateral: Secondary | ICD-10-CM | POA: Diagnosis not present

## 2019-01-30 DIAGNOSIS — Z1231 Encounter for screening mammogram for malignant neoplasm of breast: Secondary | ICD-10-CM | POA: Diagnosis not present

## 2019-01-30 DIAGNOSIS — Z803 Family history of malignant neoplasm of breast: Secondary | ICD-10-CM | POA: Diagnosis not present

## 2019-02-06 DIAGNOSIS — F329 Major depressive disorder, single episode, unspecified: Secondary | ICD-10-CM | POA: Diagnosis not present

## 2019-02-06 DIAGNOSIS — F418 Other specified anxiety disorders: Secondary | ICD-10-CM | POA: Diagnosis not present

## 2019-02-26 DIAGNOSIS — H04123 Dry eye syndrome of bilateral lacrimal glands: Secondary | ICD-10-CM | POA: Diagnosis not present

## 2019-03-13 DIAGNOSIS — E119 Type 2 diabetes mellitus without complications: Secondary | ICD-10-CM | POA: Diagnosis not present

## 2019-03-13 DIAGNOSIS — I2510 Atherosclerotic heart disease of native coronary artery without angina pectoris: Secondary | ICD-10-CM | POA: Diagnosis not present

## 2019-03-13 DIAGNOSIS — J449 Chronic obstructive pulmonary disease, unspecified: Secondary | ICD-10-CM | POA: Diagnosis not present

## 2019-03-13 DIAGNOSIS — E785 Hyperlipidemia, unspecified: Secondary | ICD-10-CM | POA: Diagnosis not present

## 2019-03-14 DIAGNOSIS — F329 Major depressive disorder, single episode, unspecified: Secondary | ICD-10-CM | POA: Diagnosis not present

## 2019-03-14 DIAGNOSIS — F418 Other specified anxiety disorders: Secondary | ICD-10-CM | POA: Diagnosis not present

## 2019-04-11 DIAGNOSIS — M450 Ankylosing spondylitis of multiple sites in spine: Secondary | ICD-10-CM | POA: Diagnosis not present

## 2019-04-11 DIAGNOSIS — M150 Primary generalized (osteo)arthritis: Secondary | ICD-10-CM | POA: Diagnosis not present

## 2019-04-11 DIAGNOSIS — M2550 Pain in unspecified joint: Secondary | ICD-10-CM | POA: Diagnosis not present

## 2019-04-11 DIAGNOSIS — M545 Low back pain: Secondary | ICD-10-CM | POA: Diagnosis not present

## 2019-07-11 DIAGNOSIS — M810 Age-related osteoporosis without current pathological fracture: Secondary | ICD-10-CM | POA: Diagnosis not present

## 2019-07-11 DIAGNOSIS — Z23 Encounter for immunization: Secondary | ICD-10-CM | POA: Diagnosis not present

## 2019-08-05 DIAGNOSIS — R5383 Other fatigue: Secondary | ICD-10-CM | POA: Diagnosis not present

## 2019-08-05 DIAGNOSIS — M450 Ankylosing spondylitis of multiple sites in spine: Secondary | ICD-10-CM | POA: Diagnosis not present

## 2019-08-05 DIAGNOSIS — M2550 Pain in unspecified joint: Secondary | ICD-10-CM | POA: Diagnosis not present

## 2019-08-05 DIAGNOSIS — M545 Low back pain: Secondary | ICD-10-CM | POA: Diagnosis not present

## 2019-08-05 DIAGNOSIS — M150 Primary generalized (osteo)arthritis: Secondary | ICD-10-CM | POA: Diagnosis not present

## 2019-09-16 ENCOUNTER — Other Ambulatory Visit: Payer: Self-pay

## 2019-09-16 ENCOUNTER — Ambulatory Visit: Payer: Federal, State, Local not specified - PPO

## 2019-09-16 DIAGNOSIS — F17210 Nicotine dependence, cigarettes, uncomplicated: Secondary | ICD-10-CM

## 2019-09-16 DIAGNOSIS — Z122 Encounter for screening for malignant neoplasm of respiratory organs: Secondary | ICD-10-CM

## 2019-09-16 DIAGNOSIS — M810 Age-related osteoporosis without current pathological fracture: Secondary | ICD-10-CM | POA: Diagnosis not present

## 2019-09-16 DIAGNOSIS — E7849 Other hyperlipidemia: Secondary | ICD-10-CM | POA: Diagnosis not present

## 2019-09-16 DIAGNOSIS — E119 Type 2 diabetes mellitus without complications: Secondary | ICD-10-CM | POA: Diagnosis not present

## 2019-09-18 ENCOUNTER — Other Ambulatory Visit: Payer: Self-pay

## 2019-09-18 ENCOUNTER — Ambulatory Visit: Payer: Federal, State, Local not specified - PPO | Admitting: Certified Nurse Midwife

## 2019-09-18 ENCOUNTER — Encounter: Payer: Self-pay | Admitting: Certified Nurse Midwife

## 2019-09-18 VITALS — BP 112/78 | HR 60 | Temp 97.2°F | Resp 16 | Ht 62.25 in | Wt 170.0 lb

## 2019-09-18 DIAGNOSIS — Z8739 Personal history of other diseases of the musculoskeletal system and connective tissue: Secondary | ICD-10-CM | POA: Diagnosis not present

## 2019-09-18 DIAGNOSIS — Z780 Asymptomatic menopausal state: Secondary | ICD-10-CM | POA: Diagnosis not present

## 2019-09-18 DIAGNOSIS — Z01419 Encounter for gynecological examination (general) (routine) without abnormal findings: Secondary | ICD-10-CM | POA: Diagnosis not present

## 2019-09-18 DIAGNOSIS — N952 Postmenopausal atrophic vaginitis: Secondary | ICD-10-CM

## 2019-09-18 NOTE — Progress Notes (Signed)
64 y.o. G43P2002  Married  Caucasian Fe here to establish gyn care and  for annual exam. No gyn exam in the past 15 years. History of hysterectomy with ?left 1/2 ovary retained. Post menopausal with vaginal dryness. No sexual activity in 1 years. Spouse has genital warts and they stopped having sexual activity. Sees Dr. Brigitte Pulse for aex, osteoporosis, cholesterol, diabetes, anxiety/depression management. Working on smoking cessation now with Nicotine patch. No other health issues today  No LMP recorded. Patient has had a hysterectomy.          Sexually active: No.  The current method of family planning is status post hysterectomy.    Exercising: No.  exercise Smoker:  no  Review of Systems  Constitutional: Negative.   HENT: Negative.   Eyes: Negative.   Respiratory: Negative.   Cardiovascular: Negative.   Gastrointestinal: Negative.   Genitourinary: Negative.   Musculoskeletal: Negative.   Skin: Negative.   Neurological: Negative.   Endo/Heme/Allergies: Negative.   Psychiatric/Behavioral: Negative.     Health Maintenance: Pap:  Many yrs ago History of Abnormal Pap: yes MMG:  07/2019 neg per patient, history of cysts, no surgeries Self Breast exams: yes Colonoscopy:  2019  BMD:   1-2 yrs ago Dr. Brigitte Pulse manages TDaP:  2019 Shingles: not done Pneumonia: had done Hep C and HIV: not done Labs: PCP   reports that she has quit smoking. Her smoking use included cigarettes. She has a 43.00 pack-year smoking history. She has never used smokeless tobacco. She reports that she does not drink alcohol or use drugs.  Past Medical History:  Diagnosis Date  . Abnormal Pap smear of cervix   . Ankylosing spondylitis ([place])   . Anxiety and depression   . Arthritis   . Asthma   . Colon polyps   . COPD (chronic obstructive pulmonary disease) ([place])   . Diabetes mellitus without complication ([place])   . Endometriosis   . GERD (gastroesophageal reflux disease)   . Hx of colonic polyps 12/30/2014  .  Hyperlipidemia   . Internal hemorrhoids with prolapse - Gr 2 12/17/2014  . Migraines   . Nicotine addiction   . Osteopenia   . Osteoporosis   . Plantar fasciitis   . Urinary and fecal incontinence 05/17/2018  . Vitamin D deficiency     Past Surgical History:  Procedure Laterality Date  . COLONOSCOPY    . CRYOTHERAPY     for abnormal pap smear  . HEMORRHOID BANDING  02/2015  . LAPAROSCOPIC CHOLECYSTECTOMY  09/2017   Dr. Georgette Dover  . SHOULDER ARTHROSCOPY Right    bursitis  . SHOULDER SURGERY Left   . TONSILLECTOMY    . TOTAL ABDOMINAL HYSTERECTOMY  1986  . UPPER GASTROINTESTINAL ENDOSCOPY      Current Outpatient Medications  Medication Sig Dispense Refill  . albuterol (PROVENTIL HFA;VENTOLIN HFA) 108 (90 BASE) MCG/ACT inhaler Inhale into the lungs every 6 (six) hours as needed for wheezing or shortness of breath.    . ALPRAZolam (XANAX) 0.5 MG tablet Take 0.5 mg by mouth at bedtime as needed for anxiety or sleep.     . budesonide-formoterol (SYMBICORT) 80-4.5 MCG/ACT inhaler Inhale 2 puffs into the lungs 2 (two) times daily.    Marland Kitchen desvenlafaxine (PRISTIQ) 50 MG 24 hr tablet Take 50 mg by mouth daily.    . diclofenac (VOLTAREN) 75 MG EC tablet Take 75 mg by mouth 2 (two) times daily.    . ergocalciferol (VITAMIN D2) 50000 UNITS capsule Take 50,000 Units by  mouth 2 (two) times a week.     . fluticasone (FLONASE) 50 MCG/ACT nasal spray Place 1 spray into the nose daily as needed for allergies or rhinitis.     . metFORMIN (GLUCOPHAGE) 1000 MG tablet Take 1,000 mg by mouth every evening.     Marland Kitchen MYRBETRIQ 25 MG TB24 tablet Take 25 mg by mouth daily.    Marland Kitchen omeprazole (PRILOSEC) 20 MG capsule Take 1 capsule by mouth daily.    Marland Kitchen PROLIA 60 MG/ML SOSY injection     . RESTASIS 0.05 % ophthalmic emulsion 1 drop 2 (two) times daily.    . rosuvastatin (CRESTOR) 10 MG tablet Take 10 mg by mouth daily.    . Secukinumab (COSENTYX SENSOREADY PEN) 150 MG/ML SOAJ Cosentyx Pen 150 mg/mL subcutaneous    .  EPINEPHrine 0.3 mg/0.3 mL IJ SOAJ injection Inject 0.3 mg into the muscle as needed for anaphylaxis.  1   Current Facility-Administered Medications  Medication Dose Route Frequency Provider Last Rate Last Admin  . 0.9 %  sodium chloride infusion  500 mL Intravenous Once Gatha Mayer, MD        Family History  Problem Relation Age of Onset  . Bladder Cancer Father   . Esophageal cancer Mother   . Melanoma Brother   . Heart disease Other        mother's entire family-13 Bro and Sis  . Brain cancer Paternal Aunt   . Lung cancer Paternal Uncle   . Bone cancer Paternal Uncle   . Lung cancer Paternal Aunt   . Stroke Maternal Grandmother   . Diabetes Paternal Grandmother   . Leukemia Paternal Aunt     ROS:  Pertinent items are noted in HPI.  Otherwise, a comprehensive ROS was negative.  Exam:   BP 112/78   Pulse 60   Temp (!) 97.2 F (36.2 C) (Skin)   Resp 16   Ht 5' 2.25" (1.581 m)   Wt 170 lb (77.1 kg)   BMI 30.84 kg/m  Height: 5' 2.25" (158.1 cm) Ht Readings from Last 3 Encounters:  09/18/19 5' 2.25" (1.581 m)  08/31/18 5\' 2"  (1.575 m)  08/27/18 5\' 2"  (1.575 m)    General appearance: alert, cooperative and appears stated age Head: Normocephalic, without obvious abnormality, atraumatic Neck: no adenopathy, supple, symmetrical, trachea midline and thyroid normal to inspection and palpation and no nodules palpated Lungs: clear to auscultation bilaterally Breasts: normal appearance, no masses or tenderness, No nipple retraction or dimpling, No nipple discharge or bleeding, No axillary or supraclavicular adenopathy Heart: regular rate and rhythm Abdomen: soft, non-tender; no masses,  no organomegaly Extremities: extremities normal, atraumatic, no cyanosis or edema Skin: Skin color, texture, turgor normal. No rashes or lesions Lymph nodes: Cervical, supraclavicular, and axillary nodes normal. No abnormal inguinal nodes palpated Neurologic: Grossly normal   Pelvic:  External genitalia:  no lesions, no genital warts noted              Urethra:  normal appearing urethra with no masses, tenderness or lesions              Bartholin's and Skene's: normal                 Vagina: normal appearing vagina with normal color and discharge, no lesions or genital warts              Cervix: absent              Pap taken: No.  Bimanual Exam:  Uterus:  uterus absent              Adnexa: no mass, fullness, tenderness and right adnexa surgically absent, unable to palpate left               Rectovaginal: Confirms               Anus:  normal sphincter tone, no lesions  Chaperone present: yes  A:  Well Woman with normal exam  Post menopausal not HRT s/pTAH with right S&O Left partial ovary retained per patient  No genital warts noted(spouse history of)  Vaginal dryness mild  Glucose, Vitamin D,Cholesterol, Urinary urgency management with PCP  Osteoporosis on Prolia with PCP management    P:   Reviewed health and wellness pertinent to exam  Discussed genital wart concern and appearance if occurs. Etiology discussed, questions addressed.  Discussed coconut oil use for vaginal dryness.  Continue follow up with PCP as indicated  Pap smear: no   counseled on breast self exam, mammography screening, feminine hygiene, menopause, adequate intake of calcium and vitamin D, diet and exercise, Kegel's exercises  return annually or prn  An After Visit Summary was printed and given to the patient.

## 2019-09-18 NOTE — Patient Instructions (Signed)

## 2019-09-18 NOTE — Addendum Note (Signed)
Addended by: Regina Eck on: 09/18/2019 04:46 PM   Modules accepted: Level of Service

## 2019-09-19 ENCOUNTER — Other Ambulatory Visit: Payer: Self-pay | Admitting: *Deleted

## 2019-09-19 DIAGNOSIS — F17210 Nicotine dependence, cigarettes, uncomplicated: Secondary | ICD-10-CM

## 2019-09-23 DIAGNOSIS — I2510 Atherosclerotic heart disease of native coronary artery without angina pectoris: Secondary | ICD-10-CM | POA: Diagnosis not present

## 2019-09-23 DIAGNOSIS — E1149 Type 2 diabetes mellitus with other diabetic neurological complication: Secondary | ICD-10-CM | POA: Diagnosis not present

## 2019-09-23 DIAGNOSIS — F17200 Nicotine dependence, unspecified, uncomplicated: Secondary | ICD-10-CM | POA: Diagnosis not present

## 2019-09-23 DIAGNOSIS — R202 Paresthesia of skin: Secondary | ICD-10-CM | POA: Diagnosis not present

## 2019-09-23 DIAGNOSIS — G629 Polyneuropathy, unspecified: Secondary | ICD-10-CM | POA: Diagnosis not present

## 2019-09-23 DIAGNOSIS — J449 Chronic obstructive pulmonary disease, unspecified: Secondary | ICD-10-CM | POA: Diagnosis not present

## 2019-09-23 DIAGNOSIS — E785 Hyperlipidemia, unspecified: Secondary | ICD-10-CM | POA: Diagnosis not present

## 2019-09-23 DIAGNOSIS — Z1389 Encounter for screening for other disorder: Secondary | ICD-10-CM | POA: Diagnosis not present

## 2019-09-23 DIAGNOSIS — Z0000 Encounter for general adult medical examination without abnormal findings: Secondary | ICD-10-CM | POA: Diagnosis not present

## 2019-09-24 DIAGNOSIS — F17200 Nicotine dependence, unspecified, uncomplicated: Secondary | ICD-10-CM | POA: Diagnosis not present

## 2019-09-24 DIAGNOSIS — I2510 Atherosclerotic heart disease of native coronary artery without angina pectoris: Secondary | ICD-10-CM | POA: Diagnosis not present

## 2019-09-24 DIAGNOSIS — E1149 Type 2 diabetes mellitus with other diabetic neurological complication: Secondary | ICD-10-CM | POA: Diagnosis not present

## 2019-09-24 DIAGNOSIS — R82998 Other abnormal findings in urine: Secondary | ICD-10-CM | POA: Diagnosis not present

## 2019-09-24 DIAGNOSIS — E7849 Other hyperlipidemia: Secondary | ICD-10-CM | POA: Diagnosis not present

## 2019-09-30 DIAGNOSIS — S7001XA Contusion of right hip, initial encounter: Secondary | ICD-10-CM | POA: Diagnosis not present

## 2019-09-30 DIAGNOSIS — M25551 Pain in right hip: Secondary | ICD-10-CM | POA: Diagnosis not present

## 2019-10-21 DIAGNOSIS — F329 Major depressive disorder, single episode, unspecified: Secondary | ICD-10-CM | POA: Diagnosis not present

## 2019-10-28 DIAGNOSIS — F329 Major depressive disorder, single episode, unspecified: Secondary | ICD-10-CM | POA: Diagnosis not present

## 2019-11-04 DIAGNOSIS — M450 Ankylosing spondylitis of multiple sites in spine: Secondary | ICD-10-CM | POA: Diagnosis not present

## 2019-11-04 DIAGNOSIS — M545 Low back pain: Secondary | ICD-10-CM | POA: Diagnosis not present

## 2019-11-04 DIAGNOSIS — M150 Primary generalized (osteo)arthritis: Secondary | ICD-10-CM | POA: Diagnosis not present

## 2019-11-04 DIAGNOSIS — M2550 Pain in unspecified joint: Secondary | ICD-10-CM | POA: Diagnosis not present

## 2019-12-03 ENCOUNTER — Encounter: Payer: Self-pay | Admitting: Certified Nurse Midwife

## 2019-12-11 DIAGNOSIS — Z23 Encounter for immunization: Secondary | ICD-10-CM | POA: Diagnosis not present

## 2020-01-09 DIAGNOSIS — Z23 Encounter for immunization: Secondary | ICD-10-CM | POA: Diagnosis not present

## 2020-01-23 DIAGNOSIS — R05 Cough: Secondary | ICD-10-CM | POA: Diagnosis not present

## 2020-01-23 DIAGNOSIS — F17200 Nicotine dependence, unspecified, uncomplicated: Secondary | ICD-10-CM | POA: Diagnosis not present

## 2020-01-23 DIAGNOSIS — J441 Chronic obstructive pulmonary disease with (acute) exacerbation: Secondary | ICD-10-CM | POA: Diagnosis not present

## 2020-01-23 DIAGNOSIS — J309 Allergic rhinitis, unspecified: Secondary | ICD-10-CM | POA: Diagnosis not present

## 2020-01-23 DIAGNOSIS — E1149 Type 2 diabetes mellitus with other diabetic neurological complication: Secondary | ICD-10-CM | POA: Diagnosis not present

## 2020-04-21 DIAGNOSIS — H2513 Age-related nuclear cataract, bilateral: Secondary | ICD-10-CM | POA: Diagnosis not present

## 2020-04-21 DIAGNOSIS — H04123 Dry eye syndrome of bilateral lacrimal glands: Secondary | ICD-10-CM | POA: Diagnosis not present

## 2020-04-21 DIAGNOSIS — H5330 Unspecified disorder of binocular vision: Secondary | ICD-10-CM | POA: Diagnosis not present

## 2020-04-21 DIAGNOSIS — M810 Age-related osteoporosis without current pathological fracture: Secondary | ICD-10-CM | POA: Diagnosis not present

## 2020-04-21 DIAGNOSIS — E119 Type 2 diabetes mellitus without complications: Secondary | ICD-10-CM | POA: Diagnosis not present

## 2020-04-23 DIAGNOSIS — Z1231 Encounter for screening mammogram for malignant neoplasm of breast: Secondary | ICD-10-CM | POA: Diagnosis not present

## 2020-05-01 DIAGNOSIS — R208 Other disturbances of skin sensation: Secondary | ICD-10-CM | POA: Diagnosis not present

## 2020-05-01 DIAGNOSIS — L918 Other hypertrophic disorders of the skin: Secondary | ICD-10-CM | POA: Diagnosis not present

## 2020-05-01 DIAGNOSIS — L814 Other melanin hyperpigmentation: Secondary | ICD-10-CM | POA: Diagnosis not present

## 2020-05-01 DIAGNOSIS — L821 Other seborrheic keratosis: Secondary | ICD-10-CM | POA: Diagnosis not present

## 2020-05-01 DIAGNOSIS — Z85828 Personal history of other malignant neoplasm of skin: Secondary | ICD-10-CM | POA: Diagnosis not present

## 2020-05-01 DIAGNOSIS — D225 Melanocytic nevi of trunk: Secondary | ICD-10-CM | POA: Diagnosis not present

## 2020-06-04 DIAGNOSIS — Z79899 Other long term (current) drug therapy: Secondary | ICD-10-CM | POA: Diagnosis not present

## 2020-06-04 DIAGNOSIS — M450 Ankylosing spondylitis of multiple sites in spine: Secondary | ICD-10-CM | POA: Diagnosis not present

## 2020-06-04 DIAGNOSIS — M150 Primary generalized (osteo)arthritis: Secondary | ICD-10-CM | POA: Diagnosis not present

## 2020-06-04 DIAGNOSIS — M545 Low back pain: Secondary | ICD-10-CM | POA: Diagnosis not present

## 2020-06-04 DIAGNOSIS — R5383 Other fatigue: Secondary | ICD-10-CM | POA: Diagnosis not present

## 2020-06-04 DIAGNOSIS — M2550 Pain in unspecified joint: Secondary | ICD-10-CM | POA: Diagnosis not present

## 2020-09-09 ENCOUNTER — Other Ambulatory Visit: Payer: Self-pay | Admitting: *Deleted

## 2020-09-09 DIAGNOSIS — F17210 Nicotine dependence, cigarettes, uncomplicated: Secondary | ICD-10-CM

## 2020-10-13 ENCOUNTER — Other Ambulatory Visit: Payer: Self-pay

## 2020-10-13 ENCOUNTER — Ambulatory Visit (INDEPENDENT_AMBULATORY_CARE_PROVIDER_SITE_OTHER): Payer: Federal, State, Local not specified - PPO

## 2020-10-13 ENCOUNTER — Ambulatory Visit: Payer: Federal, State, Local not specified - PPO

## 2020-10-13 DIAGNOSIS — I700 Atherosclerosis of aorta: Secondary | ICD-10-CM | POA: Diagnosis not present

## 2020-10-13 DIAGNOSIS — J439 Emphysema, unspecified: Secondary | ICD-10-CM | POA: Diagnosis not present

## 2020-10-13 DIAGNOSIS — F17210 Nicotine dependence, cigarettes, uncomplicated: Secondary | ICD-10-CM

## 2020-10-15 DIAGNOSIS — E559 Vitamin D deficiency, unspecified: Secondary | ICD-10-CM | POA: Diagnosis not present

## 2020-10-15 DIAGNOSIS — M810 Age-related osteoporosis without current pathological fracture: Secondary | ICD-10-CM | POA: Diagnosis not present

## 2020-10-15 DIAGNOSIS — E785 Hyperlipidemia, unspecified: Secondary | ICD-10-CM | POA: Diagnosis not present

## 2020-10-15 DIAGNOSIS — E1149 Type 2 diabetes mellitus with other diabetic neurological complication: Secondary | ICD-10-CM | POA: Diagnosis not present

## 2020-10-20 DIAGNOSIS — M810 Age-related osteoporosis without current pathological fracture: Secondary | ICD-10-CM | POA: Diagnosis not present

## 2020-10-21 DIAGNOSIS — R82998 Other abnormal findings in urine: Secondary | ICD-10-CM | POA: Diagnosis not present

## 2020-10-21 DIAGNOSIS — Z0000 Encounter for general adult medical examination without abnormal findings: Secondary | ICD-10-CM | POA: Diagnosis not present

## 2020-10-21 DIAGNOSIS — E1149 Type 2 diabetes mellitus with other diabetic neurological complication: Secondary | ICD-10-CM | POA: Diagnosis not present

## 2020-10-21 DIAGNOSIS — Z1331 Encounter for screening for depression: Secondary | ICD-10-CM | POA: Diagnosis not present

## 2020-10-21 DIAGNOSIS — Z1389 Encounter for screening for other disorder: Secondary | ICD-10-CM | POA: Diagnosis not present

## 2020-10-22 NOTE — Progress Notes (Signed)
Please call patient and let them  know their  low dose Ct was read as a Lung RADS 2: nodules that are benign in appearance and behavior with a very low likelihood of becoming a clinically active cancer due to size or lack of growth. Recommendation per radiology is for a repeat LDCT in 12 months. .Please let them  know we will order and schedule their  annual screening scan for 10/2021 Please let them  know there was notation of CAD on their  scan.  Please remind the patient  that this is a non-gated exam therefore degree or severity of disease  cannot be determined. Please have them  follow up with their PCP regarding potential risk factor modification, dietary therapy or pharmacologic therapy if clinically indicated. Pt.  is  currently on statin therapy. Please place order for annual  screening scan for  10/2021 and fax results to PCP. Thanks so much.  Please have patient follow up with PCP regarding CAD findings. Thanks so much.

## 2020-11-02 ENCOUNTER — Encounter: Payer: Self-pay | Admitting: *Deleted

## 2020-11-02 ENCOUNTER — Other Ambulatory Visit: Payer: Self-pay | Admitting: *Deleted

## 2020-11-02 DIAGNOSIS — F17210 Nicotine dependence, cigarettes, uncomplicated: Secondary | ICD-10-CM

## 2020-11-03 DIAGNOSIS — H53123 Transient visual loss, bilateral: Secondary | ICD-10-CM | POA: Diagnosis not present

## 2020-11-04 DIAGNOSIS — G43109 Migraine with aura, not intractable, without status migrainosus: Secondary | ICD-10-CM | POA: Diagnosis not present

## 2020-11-05 ENCOUNTER — Telehealth: Payer: Self-pay | Admitting: Acute Care

## 2020-11-05 NOTE — Telephone Encounter (Signed)
Pt informed of CT results per Eric Form, NP.  PT verbalized understanding.  Nothing further needed at this time.

## 2020-11-20 DIAGNOSIS — H6983 Other specified disorders of Eustachian tube, bilateral: Secondary | ICD-10-CM | POA: Diagnosis not present

## 2020-11-20 DIAGNOSIS — H9313 Tinnitus, bilateral: Secondary | ICD-10-CM | POA: Diagnosis not present

## 2020-11-20 DIAGNOSIS — H903 Sensorineural hearing loss, bilateral: Secondary | ICD-10-CM | POA: Diagnosis not present

## 2020-11-25 ENCOUNTER — Other Ambulatory Visit: Payer: Self-pay | Admitting: Neurology

## 2020-11-26 ENCOUNTER — Ambulatory Visit: Payer: Federal, State, Local not specified - PPO | Admitting: Neurology

## 2020-11-26 ENCOUNTER — Encounter: Payer: Self-pay | Admitting: Neurology

## 2020-11-26 VITALS — BP 121/80 | HR 96 | Ht 61.5 in | Wt 153.0 lb

## 2020-11-26 DIAGNOSIS — G4719 Other hypersomnia: Secondary | ICD-10-CM

## 2020-11-26 DIAGNOSIS — N393 Stress incontinence (female) (male): Secondary | ICD-10-CM | POA: Diagnosis not present

## 2020-11-26 DIAGNOSIS — J439 Emphysema, unspecified: Secondary | ICD-10-CM | POA: Insufficient documentation

## 2020-11-26 DIAGNOSIS — M450 Ankylosing spondylitis of multiple sites in spine: Secondary | ICD-10-CM

## 2020-11-26 DIAGNOSIS — R0683 Snoring: Secondary | ICD-10-CM | POA: Diagnosis not present

## 2020-11-26 MED ORDER — TRAZODONE HCL 50 MG PO TABS: 50.0000 mg | ORAL_TABLET | Freq: Every day | ORAL | 3 refills | Status: DC

## 2020-11-26 NOTE — Patient Instructions (Addendum)
Trazodone Tablets What is this medicine? TRAZODONE (TRAZ oh done) is used to treat depression. This medicine may be used for other purposes; ask your health care provider or pharmacist if you have questions. COMMON BRAND NAME(S): Desyrel What should I tell my health care provider before I take this medicine? They need to know if you have any of these conditions:  attempted suicide or thinking about it  bipolar disorder  bleeding problems  glaucoma  heart disease, or previous heart attack  irregular heart beat  kidney or liver disease  low levels of sodium in the blood  an unusual or allergic reaction to trazodone, other medicines, foods, dyes or preservatives  pregnant or trying to get pregnant  breast-feeding How should I use this medicine? Take this medicine by mouth with a glass of water. Follow the directions on the prescription label. Take this medicine shortly after a meal or a light snack. Take your medicine at regular intervals. Do not take your medicine more often than directed. Do not stop taking this medicine suddenly except upon the advice of your doctor. Stopping this medicine too quickly may cause serious side effects or your condition may worsen. A special MedGuide will be given to you by the pharmacist with each prescription and refill. Be sure to read this information carefully each time. Talk to your pediatrician regarding the use of this medicine in children. Special care may be needed. Overdosage: If you think you have taken too much of this medicine contact a poison control center or emergency room at once. NOTE: This medicine is only for you. Do not share this medicine with others. What if I miss a dose? If you miss a dose, take it as soon as you can. If it is almost time for your next dose, take only that dose. Do not take double or extra doses. What may interact with this medicine? Do not take this medicine with any of the following  medications:  certain medicines for fungal infections like fluconazole, itraconazole, ketoconazole, posaconazole, voriconazole  cisapride  dronedarone  linezolid  MAOIs like Carbex, Eldepryl, Marplan, Nardil, and Parnate  mesoridazine  methylene blue (injected into a vein)  pimozide  saquinavir  thioridazine This medicine may also interact with the following medications:  alcohol  antiviral medicines for HIV or AIDS  aspirin and aspirin-like medicines  barbiturates like phenobarbital  certain medicines for blood pressure, heart disease, irregular heart beat  certain medicines for depression, anxiety, or psychotic disturbances  certain medicines for migraine headache like almotriptan, eletriptan, frovatriptan, naratriptan, rizatriptan, sumatriptan, zolmitriptan  certain medicines for seizures like carbamazepine and phenytoin  certain medicines for sleep  certain medicines that treat or prevent blood clots like dalteparin, enoxaparin, warfarin  digoxin  fentanyl  lithium  NSAIDS, medicines for pain and inflammation, like ibuprofen or naproxen  other medicines that prolong the QT interval (cause an abnormal heart rhythm) like dofetilide  rasagiline  supplements like St. John's wort, kava kava, valerian  tramadol  tryptophan This list may not describe all possible interactions. Give your health care provider a list of all the medicines, herbs, non-prescription drugs, or dietary supplements you use. Also tell them if you smoke, drink alcohol, or use illegal drugs. Some items may interact with your medicine. What should I watch for while using this medicine? Tell your doctor if your symptoms do not get better or if they get worse. Visit your doctor or health care professional for regular checks on your progress. Because it may take   several weeks to see the full effects of this medicine, it is important to continue your treatment as prescribed by your  doctor. Patients and their families should watch out for new or worsening thoughts of suicide or depression. Also watch out for sudden changes in feelings such as feeling anxious, agitated, panicky, irritable, hostile, aggressive, impulsive, severely restless, overly excited and hyperactive, or not being able to sleep. If this happens, especially at the beginning of treatment or after a change in dose, call your health care professional. Dennis Bast may get drowsy or dizzy. Do not drive, use machinery, or do anything that needs mental alertness until you know how this medicine affects you. Do not stand or sit up quickly, especially if you are an older patient. This reduces the risk of dizzy or fainting spells. Alcohol may interfere with the effect of this medicine. Avoid alcoholic drinks. This medicine may cause dry eyes and blurred vision. If you wear contact lenses you may feel some discomfort. Lubricating drops may help. See your eye doctor if the problem does not go away or is severe. Your mouth may get dry. Chewing sugarless gum, sucking hard candy and drinking plenty of water may help. Contact your doctor if the problem does not go away or is severe. What side effects may I notice from receiving this medicine? Side effects that you should report to your doctor or health care professional as soon as possible:  allergic reactions like skin rash, itching or hives, swelling of the face, lips, or tongue  elevated mood, decreased need for sleep, racing thoughts, impulsive behavior  confusion  fast, irregular heartbeat  feeling faint or lightheaded, falls  feeling agitated, angry, or irritable  loss of balance or coordination  painful or prolonged erections  restlessness, pacing, inability to keep still  suicidal thoughts or other mood changes  tremors  trouble sleeping  seizures  unusual bleeding or bruising Side effects that usually do not require medical attention (report to your doctor  or health care professional if they continue or are bothersome):  change in sex drive or performance  change in appetite or weight  constipation  headache  muscle aches or pains  nausea This list may not describe all possible side effects. Call your doctor for medical advice about side effects. You may report side effects to FDA at 1-800-FDA-1088. Where should I keep my medicine? Keep out of the reach of children. Store at room temperature between 15 and 30 degrees C (59 to 86 degrees F). Protect from light. Keep container tightly closed. Throw away any unused medicine after the expiration date. NOTE: This sheet is a summary. It may not cover all possible information. If you have questions about this medicine, talk to your doctor, pharmacist, or health care provider.  2021 Elsevier/Gold Standard (2020-07-20 14:46:11) Sleep Apnea Sleep apnea affects breathing during sleep. It causes breathing to stop for a short time or to become shallow. It can also increase the risk of:  Heart attack.  Stroke.  Being very overweight (obese).  Diabetes.  Heart failure.  Irregular heartbeat. The goal of treatment is to help you breathe normally again. What are the causes? There are three kinds of sleep apnea:  Obstructive sleep apnea. This is caused by a blocked or collapsed airway.  Central sleep apnea. This happens when the brain does not send the right signals to the muscles that control breathing.  Mixed sleep apnea. This is a combination of obstructive and central sleep apnea. The most common  cause of this condition is a collapsed or blocked airway. This can happen if:  Your throat muscles are too relaxed.  Your tongue and tonsils are too large.  You are overweight.  Your airway is too small.   What increases the risk?  Being overweight.  Smoking.  Having a small airway.  Being older.  Being female.  Drinking alcohol.  Taking medicines to calm yourself (sedatives or  tranquilizers).  Having family members with the condition. What are the signs or symptoms?  Trouble staying asleep.  Being sleepy or tired during the day.  Getting angry a lot.  Loud snoring.  Headaches in the morning.  Not being able to focus your mind (concentrate).  Forgetting things.  Less interest in sex.  Mood swings.  Personality changes.  Feelings of sadness (depression).  Waking up a lot during the night to pee (urinate).  Dry mouth.  Sore throat. How is this diagnosed?  Your medical history.  A physical exam.  A test that is done when you are sleeping (sleep study). The test is most often done in a sleep lab but may also be done at home. How is this treated?  Sleeping on your side.  Using a medicine to get rid of mucus in your nose (decongestant).  Avoiding the use of alcohol, medicines to help you relax, or certain pain medicines (narcotics).  Losing weight, if needed.  Changing your diet.  Not smoking.  Using a machine to open your airway while you sleep, such as: ? An oral appliance. This is a mouthpiece that shifts your lower jaw forward. ? A CPAP device. This device blows air through a mask when you breathe out (exhale). ? An EPAP device. This has valves that you put in each nostril. ? A BPAP device. This device blows air through a mask when you breathe in (inhale) and breathe out.  Having surgery if other treatments do not work. It is important to get treatment for sleep apnea. Without treatment, it can lead to:  High blood pressure.  Coronary artery disease.  In men, not being able to have an erection (impotence).  Reduced thinking ability.   Follow these instructions at home: Lifestyle  Make changes that your doctor recommends.  Eat a healthy diet.  Lose weight if needed.  Avoid alcohol, medicines to help you relax, and some pain medicines.  Do not use any products that contain nicotine or tobacco, such as cigarettes,  e-cigarettes, and chewing tobacco. If you need help quitting, ask your doctor. General instructions  Take over-the-counter and prescription medicines only as told by your doctor.  If you were given a machine to use while you sleep, use it only as told by your doctor.  If you are having surgery, make sure to tell your doctor you have sleep apnea. You may need to bring your device with you.  Keep all follow-up visits as told by your doctor. This is important. Contact a doctor if:  The machine that you were given to use during sleep bothers you or does not seem to be working.  You do not get better.  You get worse. Get help right away if:  Your chest hurts.  You have trouble breathing in enough air.  You have an uncomfortable feeling in your back, arms, or stomach.  You have trouble talking.  One side of your body feels weak.  A part of your face is hanging down. These symptoms may be an emergency. Do not wait  to see if the symptoms will go away. Get medical help right away. Call your local emergency services (911 in the [place] Do not drive yourself to the hospital. Summary  This condition affects breathing during sleep.  The most common cause is a collapsed or blocked airway.  The goal of treatment is to help you breathe normally while you sleep. This information is not intended to replace advice given to you by your health care provider. Make sure you discuss any questions you have with your health care provider. Document Revised: 06/15/2018 Document Reviewed: 04/24/2018 Elsevier Patient Education  [place].

## 2020-11-26 NOTE — Addendum Note (Signed)
Addended by: Larey Seat on: 11/26/2020 02:05 PM   Modules accepted: Orders

## 2020-11-26 NOTE — Progress Notes (Signed)
SLEEP MEDICINE CLINIC    Provider:  Larey Seat, MD  Primary Care Physician:  Ginger Organ., MD [place] [address]     Referring Provider: Ginger Organ., Md [address] [place],  [place] [address]          Chief Complaint according to patient   Patient presents with:    . New Patient (Initial Visit)     Presents today to address if OSA is present, she is a smoker-  An aditional concern.  She has been tolder she snores really loudly.  She is tired all the time and only averages 5 hrs of sleep at night. Had a PSG over 6  years ago,  She has ankylosing spondylosis.      HISTORY OF PRESENT ILLNESS:  Candace Medina is a 65 y.o.  Caucasian female patient seen here as a referral on 11/26/2020 from Dr Brigitte Pulse for an evaluation of poor sleep quality, fragmented sleep, non refreshing sleep   Chief concern according to patient :    Candace Medina has a past medical history of Abnormal Pap smear of cervix, Ankylosing spondylitis ([place]), Anxiety and depression, Arthritis, Asthma, Colon polyps, COPD (chronic obstructive pulmonary disease) ([place]), Diabetes mellitus without complication ([place]), Endometriosis, UARS,  COPD < GERD (gastroesophageal reflux disease), colonic polyps (12/30/2014), Hyperlipidemia, Internal hemorrhoids with prolapse - Gr 2 (12/17/2014), Ocular Migraines with visual aura, Nicotine addiction, Osteopenia, Osteoporosis, Plantar fasciitis, Urinary stress incontinence  (05/17/2018), and Vitamin D deficiency.   The patient had the first sleep study more than 10 years ago and was told she snored loudly but had no OSA. Sleep relevant medical history:  Esophagial dilation with GI - Barretts oesophagus.    Tonsillectomy in childhood, smoker with COPD.     Family medical /sleep history: no other family member on CPAP with OSA, insomnia, sleep walkers.    Social history:  Patient is retired from the Writer and retired due to  fatigue, and lives in a household with spouse and son ( recovering addict 31) - and one dog.  The couple has  2 children, 3 grandchildren.   Tobacco use; 1.5 p /pd.  ETOH use/ none ,  Caffeine intake in form of Coffee( 4 cups in AM ) Soda( every other day 1 glass) Tea ( when eating out) , but no energy drinks. Regular exercise in form of swimming. Seasonal.    Sleep habits are as follows: The patient's dinner time is before 7 PM. On Ozempic- The patient goes to bed at 11 PM and is asleep by midnight, bedroom is cool, not quiet and dark. Has a TV on.  She reads on a phone-once asleep she  continues to sleep for intervals of 2-3  hours, but wakes from hip pain- bone pain, joint pain- not bathroom breaks, se may get two portions of 2-3 hours and another periodof sleep in the later morning.  The preferred sleep position is side ways- right side-, with the support of 2 pillows. Neck pain - she uses a specal pillow.  Dreams are reportedly rare. 5-6 AM is the usual rise time. The patient wakes up spontaneously. But she goes back to be at 10-12  She reports not feeling refreshed or restored in AM, with symptoms such as dry mouth, joint pain, neck pain,   and residual fatigue. Naps are taken frequently, lasting from 1-3 hours a day and are more refreshing than nocturnal sleep.  Review of Systems: Out of a complete 14 system review, the patient complains of only the following symptoms, and all other reviewed systems are negative.:  Fatigue, sleepiness , snoring, fragmented sleep- Insomnia - due to joint pain.   How likely are you to doze in the following situations: 0 = not likely, 1 = slight chance, 2 = moderate chance, 3 = high chance   Sitting and Reading? Watching Television? Sitting inactive in a public place (theater or meeting)? As a passenger in a car for an hour without a break? Lying down in the afternoon when circumstances permit? Sitting and talking to someone? Sitting quietly after  lunch without alcohol? In a car, while stopped for a few minutes in traffic?   Total = 12/ 24 points  With daily naps.   FSS endorsed at 43/ 63 points.  GDS- 2 / 15   Social History   Socioeconomic History  . Marital status: Married    Spouse name: Not on file  . Number of children: 2  . Years of education: Not on file  . Highest education level: Not on file  Occupational History  . Occupation: [employer]  Tobacco Use  . Smoking status: Former Smoker    Packs/day: 1.00    Years: 43.00    Pack years: 43.00    Types: Cigarettes  . Smokeless tobacco: Never Used  . Tobacco comment: stopped today 09-19-2019  Substance and Sexual Activity  . Alcohol use: No    Alcohol/week: 0.0 standard drinks  . Drug use: No  . Sexual activity: Not Currently    Birth control/protection: Surgical    Comment: hysterectomy  Other Topics Concern  . 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 Bladder Cancer Father   . Esophageal cancer Mother   . Melanoma Brother   . Heart disease Other        mother's entire family-13 Bro and Sis  . Brain cancer Paternal Aunt   . Lung cancer Paternal Uncle   . Bone cancer Paternal Uncle   . Lung cancer Paternal Aunt   . Stroke Maternal Grandmother   . Diabetes Paternal Grandmother   . Leukemia Paternal Aunt     Past Medical History:  Diagnosis Date  . Abnormal Pap smear of cervix   . Ankylosing spondylitis ([place])   . Anxiety and  depression   . Arthritis   . Asthma   . Colon polyps   . COPD (chronic obstructive pulmonary disease) ([place])   . Diabetes mellitus without complication ([place])   . Endometriosis   . GERD (gastroesophageal reflux disease)   . Hx of colonic polyps 12/30/2014  . Hyperlipidemia   . Internal hemorrhoids with prolapse - Gr 2 12/17/2014  . Migraines   . Nicotine addiction   . Osteopenia   . Osteoporosis   . Plantar fasciitis   . Urinary  and fecal incontinence 05/17/2018  . Vitamin D deficiency     Past Surgical History:  Procedure Laterality Date  . COLONOSCOPY    . CRYOTHERAPY     for abnormal pap smear  . HEMORRHOID BANDING  02/2015  . LAPAROSCOPIC CHOLECYSTECTOMY  09/2017   Dr. Georgette Dover  . SHOULDER ARTHROSCOPY Right    bursitis  . SHOULDER SURGERY Left   . TONSILLECTOMY    . TOTAL ABDOMINAL HYSTERECTOMY  1986  . UPPER GASTROINTESTINAL ENDOSCOPY       Current Outpatient Medications on File Prior to Visit  Medication Sig Dispense Refill  . albuterol (PROVENTIL HFA;VENTOLIN HFA) 108 (90 BASE) MCG/ACT inhaler Inhale into the lungs every 6 (six) hours as needed for wheezing or shortness of breath.    . ALPRAZolam (XANAX) 0.5 MG tablet Take 0.5 mg by mouth at bedtime as needed for anxiety or sleep.     . budesonide-formoterol (SYMBICORT) 80-4.5 MCG/ACT inhaler Inhale 2 puffs into the lungs 2 (two) times daily.    Marland Kitchen desvenlafaxine (PRISTIQ) 50 MG 24 hr tablet Take 50 mg by mouth daily.    Marland Kitchen EPINEPHrine 0.3 mg/0.3 mL IJ SOAJ injection Inject 0.3 mg into the muscle as needed for anaphylaxis.  1  . ergocalciferol (VITAMIN D2) 50000 UNITS capsule Take 50,000 Units by mouth 2 (two) times a week.     . fluticasone (FLONASE) 50 MCG/ACT nasal spray Place 1 spray into the nose daily as needed for allergies or rhinitis.     . metFORMIN (GLUCOPHAGE) 1000 MG tablet Take 1,000 mg by mouth 2 (two) times daily with a meal.    . MYRBETRIQ 25 MG TB24 tablet Take 25 mg by mouth daily.    Marland Kitchen OZEMPIC, 1 MG/DOSE,  4 MG/3ML SOPN INJECT 1 MG UNDER THE SKIN ONCE A WEEK    . pantoprazole (PROTONIX) 40 MG tablet Take 40 mg by mouth daily.    Marland Kitchen PROLIA 60 MG/ML SOSY injection     . propranolol ER (INDERAL LA) 60 MG 24 hr capsule Take 60 mg by mouth daily.    . RESTASIS 0.05 % ophthalmic emulsion 1 drop 2 (two) times daily.    . rosuvastatin (CRESTOR) 10 MG tablet Take 10 mg by mouth daily.    . Secukinumab (COSENTYX SENSOREADY PEN) 150 MG/ML SOAJ Cosentyx Pen 150 mg/mL subcutaneous     Current Facility-Administered Medications on File Prior to Visit  Medication Dose Route Frequency Provider Last Rate Last Admin  . 0.9 %  sodium chloride infusion  500 mL Intravenous Once Gatha Mayer, MD        Allergies  Allergen Reactions  . Other Rash and Other (See Comments)    mosquitoes  . Venomil Honey Bee Venom [Honey Bee Venom] Anaphylaxis    ALL VENOMOUS INSECTS   . Boniva [Ibandronic Acid]     Couldn't walk  . Keflex [Cephalexin] Itching  . Neosporin [Neomycin-Bacitracin Zn-Polymyx]   . Tramadol   . Codeine Rash  . Morphine And Related Rash    Physical exam:  Today's Vitals   11/26/20 1249  BP: 121/80  Pulse: 96  Weight: 153 lb (69.4 kg)  Height: 5' 1.5" (1.562 m)   Body mass index is 28.44 kg/m.   Wt Readings from Last 3 Encounters:  11/26/20 153 lb (69.4 kg)  09/18/19 170 lb (77.1 kg)  08/31/18 178 lb (80.7 kg)     Ht Readings from Last 3 Encounters:  11/26/20 5' 1.5" (1.562 m)  09/18/19 5' 2.25" (  1.581 m)  08/31/18 5\' 2"  (1.575 m)      General: The patient is awake, alert and appears not in acute distress. The patient is well groomed. Head: Normocephalic, atraumatic. Neck is supple. Mallampati  2,  neck circumference:14.5  inches .  Nasal airflow patent.   Retrognathia is seen.  Dental status: crowded Cardiovascular:  Regular rate and cardiac rhythm by pulse,  without distended neck veins. Respiratory: Lungs are clear to auscultation.  Skin:  Without evidence of ankle edema,  or rash. She appears more aged than numerically expected.  Trunk: The patient's posture is erect.   Neurologic exam : The patient is awake and alert, oriented to place and time.   Memory subjective described as intact.  Attention span & concentration ability appears normal.  Speech is fluent,  without  dysarthria, dysphonia or aphasia.  Mood and affect are appropriate.   Cranial nerves: no loss of smell or taste reported  Pupils are equal and briskly reactive to light. Funduscopic exam deferred.  Dry eyes.   Extraocular movements in vertical and horizontal planes were intact and without nystagmus. No Diplopia. Visual fields by finger perimetry are intact. Hearing was impaired to soft voice -    Facial sensation intact to fine touch.  Facial motor strength- the right eye brow doesn't lift -  tongue and uvula move midline. Tongue has a discoloured spot.   Neck ROM : rotation, tilt and flexion extension were normal for age and shoulder shrug was symmetrical.    Motor exam:  Symmetric bulk, tone and ROM.   Normal tone without cog-wheeling, symmetric grip strength .   Sensory:  Fine touch  and vibration were normal.  Proprioception tested in the upper extremities was normal.   Coordination: Rapid alternating movements in the fingers/hands were of normal speed.  The Finger-to-nose maneuver was intact without evidence of ataxia, dysmetria or tremor.   Gait and station: Patient could rise unassisted from a seated position, walked without assistive device.  Stance is of normal width/ base and the patient turned with 3 steps. Fluent gait with normal arm swing.  Toe and heel walk were deferred.  Deep tendon reflexes: in the  upper and lower extremities are symmetric and intact.  Babinski response was deferred.      After spending a total time of 45 minutes face to face and additional time for physical and neurologic examination, review of laboratory studies,  personal review of imaging  studies, reports and results of other testing and review of referral information / records as far as provided in visit, I have established the following assessments:  1)  There is known COPD and loud snoring, well over a decade ago she tested negative for OSA- this may have changed. I will definitely check with a sleep test.   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 Also room for improvement on sleep habits, restricting nap time.   3) Ocular migraines more frequent while feeling unrested.  AURA - visual. 40 sicca symptoms- eye and mouth are dry. Ankylosing spondylitis can overlap.    My Plan is to proceed with:  1) attended sleep study with screening for hypoxia.  2) Insomnia attribute to pain- will need to be addressed outside.  3) sleep habits discussed and educated.   I would like to thank Ginger Organ., Md [address] [place],  [place] [address] for allowing me to meet with and to take care of this pleasant patient.   In short, Candace Ballowe  Medina is presenting with fragmented and Non- refreshing sleep and snores loudly,  I plan to follow up either personally or through our NP within 3-4 month.   CC: I will share my notes with PCP   Electronically signed by: Larey Seat, MD 11/26/2020 1:13 PM  [facility] [place] certified by The [employer] of Sleep Medicine and Diplomate of the [employer] of Sleep Medicine. Board certified In Neurology through the [place], Fellow of the [employer] of Neurology. Medical Director of [employer].

## 2020-12-14 DIAGNOSIS — M450 Ankylosing spondylitis of multiple sites in spine: Secondary | ICD-10-CM | POA: Diagnosis not present

## 2020-12-14 DIAGNOSIS — M150 Primary generalized (osteo)arthritis: Secondary | ICD-10-CM | POA: Diagnosis not present

## 2020-12-14 DIAGNOSIS — M2550 Pain in unspecified joint: Secondary | ICD-10-CM | POA: Diagnosis not present

## 2020-12-14 DIAGNOSIS — R5383 Other fatigue: Secondary | ICD-10-CM | POA: Diagnosis not present

## 2020-12-24 DIAGNOSIS — H903 Sensorineural hearing loss, bilateral: Secondary | ICD-10-CM | POA: Diagnosis not present

## 2020-12-29 ENCOUNTER — Other Ambulatory Visit: Payer: Self-pay

## 2020-12-29 ENCOUNTER — Ambulatory Visit (INDEPENDENT_AMBULATORY_CARE_PROVIDER_SITE_OTHER): Payer: Federal, State, Local not specified - PPO | Admitting: Neurology

## 2020-12-29 DIAGNOSIS — N393 Stress incontinence (female) (male): Secondary | ICD-10-CM

## 2020-12-29 DIAGNOSIS — M450 Ankylosing spondylitis of multiple sites in spine: Secondary | ICD-10-CM

## 2020-12-29 DIAGNOSIS — R0683 Snoring: Secondary | ICD-10-CM

## 2020-12-29 DIAGNOSIS — G4733 Obstructive sleep apnea (adult) (pediatric): Secondary | ICD-10-CM

## 2020-12-29 DIAGNOSIS — G4719 Other hypersomnia: Secondary | ICD-10-CM

## 2020-12-29 DIAGNOSIS — J439 Emphysema, unspecified: Secondary | ICD-10-CM

## 2020-12-29 DIAGNOSIS — G4734 Idiopathic sleep related nonobstructive alveolar hypoventilation: Secondary | ICD-10-CM

## 2021-01-07 NOTE — Progress Notes (Signed)
IMPRESSION:  1. Obstructive Sleep Apnea (OSA), mostly obstructive hypopnea with prolonged hypoxia.   RECOMMENDATIONS:  Advise full night, attended, CPAP titration study to optimize therapy. This will allow for titration to oxygen and PAP therapy.   Plan B is humidified auto CPAP with a setting of 5-15 cm water, 2 cm EPR and mask of patient's choice.

## 2021-01-07 NOTE — Addendum Note (Signed)
Addended by: Larey Seat on: 01/07/2021 05:58 PM   Modules accepted: Orders

## 2021-01-07 NOTE — Procedures (Signed)
PATIENT'S NAME:  Candace Medina, Candace Medina DOB:      [birth year]      MR#:    [id number]     DATE OF RECORDING: 12/29/2020 REFERRING M.D.:  Carmie Kanner, MD Study Performed:   Baseline Polysomnogram HISTORY:  Candace Medina is a 65 y.o. Caucasian female patient seen here as a referral on 11/26/2020 from Dr Brigitte Pulse for an evaluation of poor sleep quality, fragmented sleep, non- refreshing sleep  Candace Medina has a medical history of Abnormal Pap smear of cervix, Ankylosing spondylitis ([place]), Anxiety and depression, Arthritis, Asthma, Colon polyps, COPD (chronic obstructive pulmonary disease) ([place]), Diabetes mellitus without complication ([place]), Endometriosis, UARS,  COPD < GERD (gastroesophageal reflux disease), colonic polyps (12/30/2014), Hyperlipidemia, Internal hemorrhoids with prolapse - Gr 2 (12/17/2014), Ocular Migraines with visual aura, Nicotine addiction, Osteopenia, Osteoporosis, Plantar fasciitis, Urinary stress incontinence  (05/17/2018), and Vitamin D deficiency.    The patient had the first sleep study more than 10 years ago and was told she snored loudly but had no OSA. Sleep relevant medical history - Barrett's oesophagus.  Tonsillectomy in childhood, smoker with COPD.  The patient endorsed the Epworth Sleepiness Scale at 12/24 points.   The patient's weight 153 pounds with a height of 61 (inches), resulting in a BMI of 28.7 kg/m2. The patient's neck circumference measured 15 inches.  CURRENT MEDICATIONS: Proventil HFA, Xanax, Symbicort, Restasis, Prolia, Pristiq, Epinephrine, Vitamin D, Flonase, Glucophage, Myrbetriq, Protonix, Inderal LA, Crestor, Ozempic, Desyrel, Sodium Chloride Infusion, Cosentyx Pen   PROCEDURE:  This is a multichannel digital polysomnogram utilizing the Somnostar 11.2 system.  Electrodes and sensors were applied and monitored per AASM Specifications.   EEG, EOG, Chin and Limb EMG, were sampled at 200 Hz.  ECG, Snore and Nasal Pressure, Thermal Airflow, Respiratory Effort,  CPAP Flow and Pressure, Oximetry was sampled at 50 Hz. Digital video and audio were recorded.      BASELINE STUDY: Lights Out was at 22:22 and Lights On at 05:19.  Total recording time (TRT) was 417.5 minutes, with a total sleep time (TST) of 359.5 minutes.   The patient's sleep latency was 22.5 minutes.  REM latency was 86.5 minutes.  The sleep efficiency was 86.1 %.     SLEEP ARCHITECTURE: WASO (Wake after sleep onset) was 40.5 minutes.  There were 43.5 minutes in Stage N1, 193.5 minutes Stage N2, 9.5 minutes Stage N3 and 113 minutes in Stage REM.  The percentage of Stage N1 was 12.1%, Stage N2 was 53.8%, Stage N3 was 2.6% and Stage R (REM sleep) was 31.4%.   RESPIRATORY ANALYSIS:  There were a total of 74 respiratory events: 1 obstructive apnea, 0 central apneas and 0 mixed apneas with 73 hypopneas.    The total APNEA/HYPOPNEA INDEX (AHI) was 12.4/hour.  25 events occurred in REM sleep and 96 events in NREM. The REM AHI was 13.3 /hour, versus a non-REM AHI of 11.9/h. The patient spent 139.5 minutes of total sleep time in the supine position and 220 minutes in non-supine.  The supine AHI was 26.2/h versus a non-supine AHI of 3.5.  OXYGEN SATURATION & C02:  The Wake baseline 02 saturation was 90%, with the lowest being 84%. Time spent below 89% saturation equaled 84 minutes- nadir in REM sleep   The arousals were noted as: 35 were spontaneous, 0 were associated with PLMs, 33 were associated with respiratory events .The patient had a total of 0 Periodic Limb Movements.    Audio and video analysis did not show any  abnormal or unusual movements, behaviors, phonations or vocalizations. The patient took bathroom breaks. EKG was in keeping with normal sinus rhythm (NSR).  IMPRESSION:  1. Obstructive Sleep Apnea (OSA), mostly obstructive hypopnea with prolonged hypoxia.    RECOMMENDATIONS:  Advise full night, attended, CPAP titration study to optimize therapy. This will allow for titration to oxygen  and PAP therapy.   Plan B is humidified auto CPAP with a setting of 5-15 cm water, 2 cm EPR and mask of patient's choice.    I certify that I have reviewed the entire raw data recording prior to the issuance of this report in accordance with the Standards of Accreditation of the American Academy of Sleep Medicine (AASM)   Larey Seat, MD Diplomat, American Board of Psychiatry and Neurology  Diplomat, American Board of Sleep Medicine Market researcher, [place] [facility] at [employer]

## 2021-01-14 ENCOUNTER — Telehealth: Payer: Self-pay | Admitting: Neurology

## 2021-01-14 NOTE — Telephone Encounter (Signed)
-----   Message from Larey Seat, MD sent at 01/07/2021  5:58 PM EDT ----- IMPRESSION:  1. Obstructive Sleep Apnea (OSA), mostly obstructive hypopnea with prolonged hypoxia.    RECOMMENDATIONS:  Advise full night, attended, CPAP titration study to optimize therapy. This will allow for titration to oxygen and PAP therapy.   Plan B is humidified auto CPAP with a setting of 5-15 cm water, 2 cm EPR and mask of patient's choice.

## 2021-01-14 NOTE — Telephone Encounter (Signed)

## 2021-01-25 ENCOUNTER — Other Ambulatory Visit: Payer: Self-pay | Admitting: Neurology

## 2021-01-27 ENCOUNTER — Telehealth: Payer: Self-pay

## 2021-01-27 NOTE — Telephone Encounter (Signed)
LVM for pt to call me back to schedule sleep study  

## 2021-01-29 DIAGNOSIS — M65311 Trigger thumb, right thumb: Secondary | ICD-10-CM | POA: Diagnosis not present

## 2021-02-25 DIAGNOSIS — M65311 Trigger thumb, right thumb: Secondary | ICD-10-CM | POA: Diagnosis not present

## 2021-03-10 ENCOUNTER — Other Ambulatory Visit: Payer: Self-pay

## 2021-03-10 ENCOUNTER — Ambulatory Visit (INDEPENDENT_AMBULATORY_CARE_PROVIDER_SITE_OTHER): Payer: Federal, State, Local not specified - PPO | Admitting: Neurology

## 2021-03-10 DIAGNOSIS — M450 Ankylosing spondylitis of multiple sites in spine: Secondary | ICD-10-CM

## 2021-03-10 DIAGNOSIS — G4733 Obstructive sleep apnea (adult) (pediatric): Secondary | ICD-10-CM

## 2021-03-10 DIAGNOSIS — N393 Stress incontinence (female) (male): Secondary | ICD-10-CM

## 2021-03-10 DIAGNOSIS — R0683 Snoring: Secondary | ICD-10-CM

## 2021-03-10 DIAGNOSIS — J439 Emphysema, unspecified: Secondary | ICD-10-CM

## 2021-03-10 DIAGNOSIS — G4719 Other hypersomnia: Secondary | ICD-10-CM

## 2021-03-18 ENCOUNTER — Telehealth: Payer: Self-pay | Admitting: Neurology

## 2021-03-18 DIAGNOSIS — G4733 Obstructive sleep apnea (adult) (pediatric): Secondary | ICD-10-CM | POA: Insufficient documentation

## 2021-03-18 DIAGNOSIS — G4734 Idiopathic sleep related nonobstructive alveolar hypoventilation: Secondary | ICD-10-CM | POA: Insufficient documentation

## 2021-03-18 DIAGNOSIS — J449 Chronic obstructive pulmonary disease, unspecified: Secondary | ICD-10-CM | POA: Insufficient documentation

## 2021-03-18 NOTE — Procedures (Signed)
PATIENT'S NAME:  Candace, Medina DOB:      04/04/1956      MR#:    [id number]     DATE OF RECORDING: 03/10/2021 [facility] [place].  REFERRING M.D.:  Marton Redwood, MD Study Performed:   CPAP  Titration HISTORY:  Candace Medina is a 65 y.o. Caucasian female patient seen on 11/26/2020 from Dr Brigitte Pulse for an evaluation of poor sleep quality, fragmented sleep, non- refreshing sleep Candace Medina had the first sleep study more than 10 years ago and was told she snored loudly but had no OSA. Sleep relevant medical history - Barrett's oesophagus.  Tonsillectomy in childhood, smoker with COPD.  The patient endorsed the Epworth Sleepiness Scale at 12/24 points.   The patient's weight 153 pounds with a height of 61 (inches), resulting in a BMI of 28.7 kg/m2. The patient's neck circumference measured 15 inches.  Preceding PSG on 12-29-2020 documented The total APNEA/HYPOPNEA INDEX (AHI) at 12.4/hour, mostly hypopnea with hypoxemia. I recommended in lab titration to allow oxygen to be added should it be needed.   CURRENT MEDICATIONS: Proventil HFA, Xanax, Symbicort, Restasis, Prolia, Pristiq, Epinephrine, Vitamin D2, Flonase, Glucophage, Myrbetriq, Protonix, Inderal LA, Crestor, Secukinumab, Ozempic, Sodium Chloride Infusion, Desyrel    PROCEDURE:  This is a multichannel digital polysomnogram utilizing the SomnoStar 11.2 system.  Electrodes and sensors were applied and monitored per AASM Specifications.   EEG, EOG, Chin and Limb EMG, were sampled at 200 Hz.  ECG, Snore and Nasal Pressure, Thermal Airflow, Respiratory Effort, CPAP Flow and Pressure, Oximetry was sampled at 50 Hz. Digital video and audio were recorded.       CPAP was initiated under a nasal CPAP mask ( ResMedN20,medium) at 5 cmH20 with heated humidity per AASM standards and pressure was advanced to 8 cmH20 without EPR-  because of hypopneas, apneas and desaturations.  At a PAP pressure of 8 cmH20, there were 298 minutes of sleep observed, lot of REM  sleep rebounding and a reduction of the AHI to 0.0/h.  Lights Out was at 22:21 and Lights On at 05:13. Total recording time (TRT) was 412 minutes, with a total sleep time (TST) of 385.5 minutes. The patient's sleep latency was 13 minutes. REM latency was 13 minutes.  The sleep efficiency was 93.6 %.    SLEEP ARCHITECTURE: WASO (Wake after sleep onset) was 16.5 minutes.  There were 24 minutes in Stage N1, 190.5 minutes Stage N2, 19 minutes Stage N3 and 152 minutes in Stage REM.  The percentage of Stage N1 was 6.2%, Stage N2 was 49.4%, Stage N3 was 4.9% and Stage R (REM sleep) was 39.4%. The sleep architecture was notable for massive REM sleep rebound.     RESPIRATORY ANALYSIS:  There was a total of 9 respiratory events: 1 obstructive apnea, 0 central apneas and 0 mixed apneas with a total of 1 apnea and an apnea index (AI) of .2 /hour. There were 8 hypopneas with a hypopnea index of 1.2/hour. The patient also had 0 respiratory event related arousals (RERAs).      The total APNEA/HYPOPNEA INDEX  (AHI) was 1.4 /hour .  9 events occurred in REM sleep and 0 events in NREM. The REM AHI was 3.6 /hour versus a non-REM AHI of 0 /hour.  The patient spent 285 minutes of total sleep time in the supine position and 101 minutes in non-supine. The supine AHI was 1.7, versus a non-supine AHI of 0.6.  OXYGEN SATURATION & C02:  The baseline 02 saturation  was 94%, with the lowest being 88%. Time spent below 89% saturation equaled 0 minutes.  PERIODIC LIMB MOVEMENTS:  The patient had a total of 0 Periodic Limb Movements. The arousals were noted as: 16 were spontaneous, 0 were associated with PLMs, 1 were associated with respiratory events. Audio and video analysis did not show any abnormal or unusual movements, behaviors, phonations or vocalizations.   EKG was in keeping with sinus rhythm (NSR).  DIAGNOSIS Obstructive Sleep Apnea with hypoxemia responded to CPAP 8 cm water without EPR, both conditions, hypoxia and  apnea, were alleviated.     PLANS/RECOMMENDATIONS: auto capable CPAP with a setting of 6-10 cm water, no EPR, heated humidification, nasal mask, medium size N20 by ResMed.    DISCUSSION: A follow up appointment will be scheduled in the Sleep Clinic at [facility].   Please call [phone] with any questions.      I certify that I have reviewed the entire raw data recording prior to the issuance of this report in accordance with the Standards of Accreditation of the American Academy of Sleep Medicine (AASM)    Larey Seat, M.D. Diplomat, Tax adviser of Psychiatry and Neurology  Diplomat, Tax adviser of Sleep Medicine Market researcher, [employer] [facility] [employer]

## 2021-03-18 NOTE — Telephone Encounter (Signed)
-----   Message from Larey Seat, MD sent at 03/18/2021  1:23 PM EDT ----- DIAGNOSIS 1. Obstructive Sleep Apnea with hypoxemia responded to CPAP 8 cm water without EPR, both conditions, hypoxia and apnea, were alleviated.     PLANS/RECOMMENDATIONS: auto capable CPAP with a setting of 6-10 cm water, no EPR, heated humidification, nasal mask, medium size N20 by ResMed.    DISCUSSION: A follow up appointment will be scheduled in the Sleep Clinic at [facility].   Please call [phone] with any questions.

## 2021-03-18 NOTE — Addendum Note (Signed)
Addended by: Larey Seat on: 03/18/2021 01:23 PM   Modules accepted: Orders

## 2021-03-18 NOTE — Progress Notes (Signed)
DIAGNOSIS 1. Obstructive Sleep Apnea with hypoxemia responded to CPAP 8 cm water without EPR, both conditions, hypoxia and apnea, were alleviated.     PLANS/RECOMMENDATIONS: auto capable CPAP with a setting of 6-10 cm water, no EPR, heated humidification, nasal mask, medium size N20 by ResMed.    DISCUSSION: A follow up appointment will be scheduled in the Sleep Clinic at [facility].   Please call [phone] with any questions.

## 2021-03-18 NOTE — Telephone Encounter (Signed)
I called pt. I advised pt that Dr. Brett Fairy reviewed their sleep study results and found that pt was best tolerated with a CPAP at a pressure of 8 cm water pressure . Dr. Brett Fairy recommends that pt starts auto CPAP. I reviewed PAP compliance expectations with the pt. Pt is agreeable to starting a CPAP. I advised pt that an order will be sent to a DME, [facility] and [facility] ([facility] will call the pt within about one week after they file with the pt's insurance. [facility]) will show the pt how to use the machine, fit for masks, and troubleshoot the CPAP if needed. A follow up appt was made for insurance purposes with Dr. Brett Fairy on Oct 13,2022 at. Pt verbalized understanding to arrive 15 minutes early and bring their CPAP. A letter with all of this information in it will be mailed to the pt as a reminder. I verified with the pt that the address we have on file is correct. Pt verbalized understanding of results. Pt had no questions at this time but was encouraged to call back if questions arise. I have sent the order to [facility])  and have received confirmation that they have received the order.

## 2021-03-30 DIAGNOSIS — M65311 Trigger thumb, right thumb: Secondary | ICD-10-CM | POA: Diagnosis not present

## 2021-04-01 DIAGNOSIS — R058 Other specified cough: Secondary | ICD-10-CM | POA: Diagnosis not present

## 2021-04-01 DIAGNOSIS — U071 COVID-19: Secondary | ICD-10-CM | POA: Diagnosis not present

## 2021-04-08 DIAGNOSIS — G4733 Obstructive sleep apnea (adult) (pediatric): Secondary | ICD-10-CM | POA: Diagnosis not present

## 2021-04-12 DIAGNOSIS — Z1231 Encounter for screening mammogram for malignant neoplasm of breast: Secondary | ICD-10-CM | POA: Diagnosis not present

## 2021-04-16 DIAGNOSIS — Z91038 Other insect allergy status: Secondary | ICD-10-CM | POA: Diagnosis not present

## 2021-04-16 DIAGNOSIS — H5789 Other specified disorders of eye and adnexa: Secondary | ICD-10-CM | POA: Diagnosis not present

## 2021-04-20 DIAGNOSIS — H43313 Vitreous membranes and strands, bilateral: Secondary | ICD-10-CM | POA: Diagnosis not present

## 2021-04-20 DIAGNOSIS — H2513 Age-related nuclear cataract, bilateral: Secondary | ICD-10-CM | POA: Diagnosis not present

## 2021-04-20 DIAGNOSIS — H5330 Unspecified disorder of binocular vision: Secondary | ICD-10-CM | POA: Diagnosis not present

## 2021-04-20 DIAGNOSIS — E119 Type 2 diabetes mellitus without complications: Secondary | ICD-10-CM | POA: Diagnosis not present

## 2021-04-20 DIAGNOSIS — G43B0 Ophthalmoplegic migraine, not intractable: Secondary | ICD-10-CM | POA: Diagnosis not present

## 2021-04-21 DIAGNOSIS — E785 Hyperlipidemia, unspecified: Secondary | ICD-10-CM | POA: Diagnosis not present

## 2021-04-21 DIAGNOSIS — E1149 Type 2 diabetes mellitus with other diabetic neurological complication: Secondary | ICD-10-CM | POA: Diagnosis not present

## 2021-04-21 DIAGNOSIS — M810 Age-related osteoporosis without current pathological fracture: Secondary | ICD-10-CM | POA: Diagnosis not present

## 2021-04-22 ENCOUNTER — Other Ambulatory Visit: Payer: Self-pay | Admitting: Neurology

## 2021-05-04 DIAGNOSIS — B078 Other viral warts: Secondary | ICD-10-CM | POA: Diagnosis not present

## 2021-05-04 DIAGNOSIS — L821 Other seborrheic keratosis: Secondary | ICD-10-CM | POA: Diagnosis not present

## 2021-05-04 DIAGNOSIS — L720 Epidermal cyst: Secondary | ICD-10-CM | POA: Diagnosis not present

## 2021-05-04 DIAGNOSIS — D692 Other nonthrombocytopenic purpura: Secondary | ICD-10-CM | POA: Diagnosis not present

## 2021-05-04 DIAGNOSIS — Z85828 Personal history of other malignant neoplasm of skin: Secondary | ICD-10-CM | POA: Diagnosis not present

## 2021-05-09 DIAGNOSIS — G4733 Obstructive sleep apnea (adult) (pediatric): Secondary | ICD-10-CM | POA: Diagnosis not present

## 2021-05-19 ENCOUNTER — Telehealth: Payer: Self-pay | Admitting: Neurology

## 2021-05-19 DIAGNOSIS — G4733 Obstructive sleep apnea (adult) (pediatric): Secondary | ICD-10-CM

## 2021-05-19 NOTE — Telephone Encounter (Signed)
Called and spoke w/ pt. Advised since she recently received a regular cpap machine, insurance will not cover a travel one as well. She is aware. She spoke w/ insurance who told her since she is within 90 days of getting current machine, she can return this and get travel. She will f/u with [facility] to verify. I placed order. She will follow back up if she has any more questions/concerns.

## 2021-05-19 NOTE — Telephone Encounter (Signed)
Per [facility]: "I will reach out to her. Unfortunately we can't do that as insurance does not cover a travel pap."

## 2021-05-19 NOTE — Telephone Encounter (Signed)
Pt called states she is needing a travel CPAP machine. She contacted her insurance company and they advised her the doctor needs to put in a new order.

## 2021-05-25 DIAGNOSIS — E1149 Type 2 diabetes mellitus with other diabetic neurological complication: Secondary | ICD-10-CM | POA: Diagnosis not present

## 2021-05-25 DIAGNOSIS — R051 Acute cough: Secondary | ICD-10-CM | POA: Diagnosis not present

## 2021-06-09 DIAGNOSIS — G4733 Obstructive sleep apnea (adult) (pediatric): Secondary | ICD-10-CM | POA: Diagnosis not present

## 2021-06-15 DIAGNOSIS — M450 Ankylosing spondylitis of multiple sites in spine: Secondary | ICD-10-CM | POA: Diagnosis not present

## 2021-06-15 DIAGNOSIS — R5383 Other fatigue: Secondary | ICD-10-CM | POA: Diagnosis not present

## 2021-06-15 DIAGNOSIS — M150 Primary generalized (osteo)arthritis: Secondary | ICD-10-CM | POA: Diagnosis not present

## 2021-06-15 DIAGNOSIS — M2550 Pain in unspecified joint: Secondary | ICD-10-CM | POA: Diagnosis not present

## 2021-06-24 ENCOUNTER — Ambulatory Visit: Payer: Federal, State, Local not specified - PPO | Admitting: Neurology

## 2021-06-24 ENCOUNTER — Encounter: Payer: Self-pay | Admitting: Neurology

## 2021-06-24 VITALS — BP 141/88 | HR 91 | Ht 61.5 in | Wt 155.0 lb

## 2021-06-24 DIAGNOSIS — G4734 Idiopathic sleep related nonobstructive alveolar hypoventilation: Secondary | ICD-10-CM

## 2021-06-24 DIAGNOSIS — G4733 Obstructive sleep apnea (adult) (pediatric): Secondary | ICD-10-CM

## 2021-06-24 DIAGNOSIS — G2581 Restless legs syndrome: Secondary | ICD-10-CM

## 2021-06-24 DIAGNOSIS — J449 Chronic obstructive pulmonary disease, unspecified: Secondary | ICD-10-CM | POA: Diagnosis not present

## 2021-06-24 DIAGNOSIS — G4701 Insomnia due to medical condition: Secondary | ICD-10-CM

## 2021-06-24 MED ORDER — TRAZODONE HCL 50 MG PO TABS: ORAL_TABLET | ORAL | 2 refills | Status: DC

## 2021-06-24 NOTE — Progress Notes (Signed)
SLEEP MEDICINE CLINIC    Provider:  Larey Seat, MD  Primary Care Physician:  Ginger Organ., MD [place] [address]     Referring Provider: Ginger Organ., Md [address] [place],  [place] [address]          Chief Complaint according to patient   Patient presents with:     New Patient (Initial Visit)     Presents today to address if OSA is present, she is a smoker-  An aditional concern.  She has been tolder she snores really loudly.  She is tired all the time and only averages 5 hrs of sleep at night. Had a PSG over 6  years ago,  She has ankylosing spondylosis. Initial CPAP f/u. She took original CPAP back and got travel machine. Blowing too much air if she does not keep mouth open. Feels settings on travel machine not the same as original. Received machine from [facility] in [place], [place] ([place])      HISTORY: 06-24-2021 Candace Medina is a 65 y.o.  Caucasian female patient seen here in a RV on 06/24/2021 : The patient was invited for a sleep study in April 2022 took place on 4-19 2022 and confirmed the patient had obstructive sleep apnea with prolonged hypoxemia most of her apneic events were actually hypopneas so qualified as shallow breathing rather than frank apnea.  The patient qualified as mild sleep apnea at 12.4 AHI overall there was no REM sleep dependency, in supine sleep her AHI was 26.2 so avoiding sleeping on the back could be very helpful.  Her oxygen nadir was 84% but the time spent in hypoxia was 84 minutes.  There was significant contribution during REM sleep noted.  We asked her to return for an attended CPAP titration.  This took place on 6-29 2022 also we wanted to have the opportunity to give oxygen if needed.  CPAP was initiated on the ResMed nasal mask and 20 and medium sized and was titrated from 5-8 cmH2O but the AHI became 0.0.  REM sleep rebounded to almost 40% of the night.  She could not  recall that she was dreaming anything for a long time.  The AHI was significantly reduced and we ordered an auto CPAP with a setting between 6 and 10 cmH2O no EPR heated humidification and the N 20 ResMed mask.  She had responded very well to 8 cm water pressure.  The patient then was actually finished with an auto titration device which she later exchanged for a travel CPAP.  The auto titration however she had used through the months of August through October and it showed 100% compliant user time with an average of 7 hours and 1 minute.  Her AutoSet was actually given with a 3 cm EPR also I had ordered no EPR.  The residual AHI was 1.9 the 95th percentile pressure was 9.8 cmH2O and her residual apneas were vastly obstructive and not central in origin.   There were some higher air leaks with a nasal mask the 95th percentile air leak was qualified at 36.5 L/min.  She has been given a full facemask by choice but stated that the facial mask feels too small.  She was yesterday refitted to a medium size mask.   We need to set the travel machine now: and she used 10 cm water pressure w for most of the night- I will set it to  10 cm water but I am not sure she can get an EPR setting. If no EPR setting is possible on travel CPAP, will set to 8 cm water.    Her Migraines have improved her hypersomnia is still there, but is better.     11-26-2020 Initial referral from Dr Brigitte Pulse for an evaluation of poor sleep quality, fragmented sleep, non refreshing sleep   Chief concern according to patient :    Candace Medina has a past medical history of Abnormal Pap smear of cervix, Ankylosing spondylitis ([place]), Anxiety and depression, Arthritis, Asthma, Colon polyps, COPD (chronic obstructive pulmonary disease) ([place]), Diabetes mellitus without complication ([place]), Endometriosis, UARS,  COPD < GERD (gastroesophageal reflux disease), colonic polyps (12/30/2014), Hyperlipidemia, Internal hemorrhoids with prolapse - Gr 2  (12/17/2014), Ocular Migraines with visual aura, Nicotine addiction, Osteopenia, Osteoporosis, Plantar fasciitis, Urinary stress incontinence  (05/17/2018), and Vitamin D deficiency.   The patient had the first sleep study more than 10 years ago and was told she snored loudly but had no OSA. Sleep relevant medical history:  Esophagial dilation with GI - Barretts oesophagus.    Tonsillectomy in childhood, smoker with COPD.     Family medical /sleep history: no other family member on CPAP with OSA, insomnia, sleep walkers.    Social history:  Patient is retired from the Writer and retired due to fatigue, and lives in a household with spouse and son ( recovering addict 24) - and one dog.  The couple has  2 children, 3 grandchildren.   Tobacco use; 1.5 p /pd.  ETOH use/ none ,  Caffeine intake in form of Coffee( 4 cups in AM ) Soda( every other day 1 glass) Tea ( when eating out) , but no energy drinks. Regular exercise in form of swimming. Seasonal.    Sleep habits are as follows: The patient's dinner time is before 7 PM. On Ozempic- The patient goes to bed at 11 PM and is asleep by midnight, bedroom is cool, not quiet and dark. Has a TV on.  She reads on a phone-once asleep she  continues to sleep for intervals of 2-3  hours, but wakes from hip pain- bone pain, joint pain- not bathroom breaks, se may get two portions of 2-3 hours and another periodof sleep in the later morning.  The preferred sleep position is side ways- right side-, with the support of 2 pillows. Neck pain - she uses a specal pillow.  Dreams are reportedly rare. 5-6 AM is the usual rise time. The patient wakes up spontaneously. But she goes back to be at 10-12  She reports not feeling refreshed or restored in AM, with symptoms such as dry mouth, joint pain, neck pain,   and residual fatigue. Naps are taken frequently, lasting from 1-3 hours a day and are more refreshing than nocturnal sleep.    Review of  Systems: Out of a complete 14 system review, the patient complains of only the following symptoms, and all other reviewed systems are negative.:  Fatigue, sleepiness , snoring, fragmented sleep- Insomnia - due to joint pain.  Autoimmune disease related fatigue - ankylosing spondylitis.   RLS- improved on IRON supplement.    Still a smoker. COPD   How likely are you to doze in the following situations: 0 = not likely, 1 = slight chance, 2 = moderate chance, 3 = high chance   Sitting and Reading? Watching Television? Sitting inactive in a public place (theater or meeting)? As a  passenger in a car for an hour without a break? Lying down in the afternoon when circumstances permit? Sitting and talking to someone? Sitting quietly after lunch without alcohol? In a car, while stopped for a few minutes in traffic?   Total = 12/ 24 points  With daily naps.  Down from 18/ 24   FSS endorsed at [place] 63 points.  GDS- 2 / 15   There is known COPD and loud snoring, well over a decade ago she tested negative for OSA- this may have changed. I will definitely check with a sleep test.   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 Also room for improvement on sleep habits, restricting nap time.   3) Ocular migraines more frequent while feeling unrested.  AURA - visual. 40 sicca symptoms- eye and mouth are dry. Ankylosing spondylitis can overlap.  Social History   Socioeconomic History   Marital status: Married    Spouse name: Not on file   Number of children: 2   Years of education: Not on file   Highest education level: Not on file  Occupational History   Occupation: [employer]  Tobacco Use   Smoking status: Former    Packs/day: 1.00    Years: 43.00    Pack years: 43.00    Types: Cigarettes   Smokeless tobacco: Never   Tobacco comments:    stopped today 09-19-2019  Substance and Sexual Activity   Alcohol use: No    Alcohol/week: 0.0 standard drinks   Drug use: No   Sexual activity: Not Currently    Birth  control/protection: Surgical    Comment: hysterectomy  Other Topics Concern   Not on file  Social History Narrative   She is married, she has 1 son and one daughter   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[place] [employer]   Drinks 3 caffeinated beverages daily   She enjoys painting as a hobby   3 grandchildren, she has custody of 1 granddaughter   Some college education   Social Determinants of Radio broadcast assistant Strain: Not on file  Food Insecurity: Not on file  Transportation Needs: Not on file  Physical Activity: Not on file  Stress: Not on file  Social Connections: Not on file    Family History  Problem Relation Age of Onset   Bladder Cancer Father    Esophageal cancer Mother    Melanoma Brother    Heart disease Other        mother's entire family-13 [place] and Sis   Brain cancer Paternal Aunt    Lung cancer Paternal Uncle    Bone cancer Paternal Uncle    Lung cancer Paternal Aunt    Stroke Maternal Grandmother    Diabetes Paternal Grandmother    Leukemia Paternal Aunt     Past Medical History:  Diagnosis Date   Abnormal Pap smear of cervix    Ankylosing spondylitis ([place])    Anxiety and depression    Arthritis    Asthma    Colon polyps    COPD (chronic obstructive pulmonary disease) ([place])    Diabetes mellitus without complication ([place])    Endometriosis    GERD (gastroesophageal reflux disease)    Hx of colonic polyps 12/30/2014   Hyperlipidemia    Internal hemorrhoids with prolapse - Gr 2 12/17/2014   Migraines    Nicotine addiction    Osteopenia    Osteoporosis    Plantar fasciitis    Urinary and fecal incontinence 05/17/2018  Vitamin D deficiency     Past Surgical History:  Procedure Laterality Date   COLONOSCOPY     CRYOTHERAPY     for abnormal pap smear   HEMORRHOID BANDING  02/2015   LAPAROSCOPIC CHOLECYSTECTOMY  09/2017   Dr. Georgette Dover   SHOULDER ARTHROSCOPY Right    bursitis   SHOULDER SURGERY Left    TONSILLECTOMY     TOTAL  ABDOMINAL HYSTERECTOMY  1986   UPPER GASTROINTESTINAL ENDOSCOPY       Current Outpatient Medications on File Prior to Visit  Medication Sig Dispense Refill   albuterol (PROVENTIL HFA;VENTOLIN HFA) 108 (90 BASE) MCG/ACT inhaler Inhale into the lungs every 6 (six) hours as needed for wheezing or shortness of breath.     ALPRAZolam (XANAX) 0.5 MG tablet Take 0.5 mg by mouth at bedtime as needed for anxiety or sleep.      budesonide-formoterol (SYMBICORT) 80-4.5 MCG/ACT inhaler Inhale 2 puffs into the lungs 2 (two) times daily.     desvenlafaxine (PRISTIQ) 50 MG 24 hr tablet Take 50 mg by mouth daily.     EPINEPHrine 0.3 mg/0.3 mL IJ SOAJ injection Inject 0.3 mg into the muscle as needed for anaphylaxis.  1   ergocalciferol (VITAMIN D2) 50000 UNITS capsule Take 50,000 Units by mouth 2 (two) times a week.      fluticasone (FLONASE) 50 MCG/ACT nasal spray Place 1 spray into the nose daily as needed for allergies or rhinitis.      metFORMIN (GLUCOPHAGE) 1000 MG tablet Take 1,000 mg by mouth 2 (two) times daily with a meal.     MYRBETRIQ 25 MG TB24 tablet Take 25 mg by mouth daily.     OZEMPIC, 1 MG/DOSE, 4 MG/3ML SOPN INJECT 1 MG UNDER THE SKIN ONCE A WEEK     pantoprazole (PROTONIX) 40 MG tablet Take 40 mg by mouth daily.     PROLIA 60 MG/ML SOSY injection      propranolol ER (INDERAL LA) 60 MG 24 hr capsule Take 60 mg by mouth daily.     RESTASIS 0.05 % ophthalmic emulsion 1 drop 2 (two) times daily.     rosuvastatin (CRESTOR) 10 MG tablet Take 10 mg by mouth daily.     Secukinumab (COSENTYX SENSOREADY PEN) 150 MG/ML SOAJ Cosentyx Pen 150 mg/mL subcutaneous     traZODone (DESYREL) 50 MG tablet TAKE 1 TABLET(50 MG) BY MOUTH AT BEDTIME 90 tablet 0   Current Facility-Administered Medications on File Prior to Visit  Medication Dose Route Frequency Provider Last Rate Last Admin   0.9 %  sodium chloride infusion  500 mL Intravenous Once Gatha Mayer, MD        Allergies  Allergen Reactions    Other Rash and Other (See Comments)    mosquitoes   Venomil Honey Bee Venom [Honey Bee Venom] Anaphylaxis    ALL VENOMOUS INSECTS    Boniva [Ibandronic Acid]     Couldn't walk   Keflex [Cephalexin] Itching   Neosporin [Neomycin-Bacitracin Zn-Polymyx]    Tramadol    Codeine Rash   Morphine And Related Rash    Physical exam:  Today's Vitals   06/24/21 0919  BP: (!) 141/88  Pulse: 91  SpO2: 97%  Weight: 155 lb (70.3 kg)  Height: 5' 1.5" (1.562 m)   Body mass index is 28.81 kg/m.   Wt Readings from Last 3 Encounters:  06/24/21 155 lb (70.3 kg)  11/26/20 153 lb (69.4 kg)  09/18/19 170 lb (77.1 kg)  Ht Readings from Last 3 Encounters:  06/24/21 5' 1.5" (1.562 m)  11/26/20 5' 1.5" (1.562 m)  09/18/19 5' 2.25" (1.581 m)      General: The patient is awake, alert and appears not in acute distress. The patient is well groomed. Head: Normocephalic, atraumatic. Neck is supple. Mallampati  2,  neck circumference:14.5  inches .  Nasal airflow patent.   Retrognathia is seen.  Dental status: crowded Cardiovascular:  Regular rate and cardiac rhythm by pulse,  without distended neck veins. Respiratory: Lungs are clear to auscultation.  Skin:  Without evidence of ankle edema, or rash. She appears more aged than numerically expected.  Trunk: The patient's posture is erect.   Neurologic exam : The patient is awake and alert, oriented to place and time.   Memory subjective described as intact.  Attention span & concentration ability appears normal.  Speech is fluent,  without  dysarthria, dysphonia or aphasia.  Mood and affect are appropriate.   Cranial nerves: no loss of smell or taste reported  Pupils are equal and briskly reactive to light. Funduscopic exam deferred.  Dry eyes.   Extraocular movements in vertical and horizontal planes were intact and without nystagmus. No Diplopia. Visual fields by finger perimetry are intact. Facial motor strength- the right eye brow  doesn't lift -  tongue and uvula move midline. Tongue has a discoloured spot.   Neck ROM : rotation, tilt and flexion extension were normal for age and shoulder shrug was symmetrical.    Motor exam:  Symmetric bulk, tone and ROM.   Normal tone without cog-wheeling, symmetric grip strength .   Gait and station: Patient could rise unassisted from a seated position, walked without assistive device.  Stance is of normal width/ base and the patient turned with 3 steps. Fluent gait with normal arm swing.  Toe and heel walk were deferred.  Deep tendon reflexes: in the upper and lower extremities are symmetric and intact.  Babinski response was deferred.      After spending a total time of 31 minutes face to face and additional time for physical and neurologic examination, review of laboratory studies, the various sleep studies and her 2 cpap machine's data  personal review of imaging studies, reports and results of other testing and review of referral information / records as far as provided in visit, I have established the following assessments:  1)   She was just yesterday refitted to a medium size mask.   We need to set the travel machine now: and she used 10 cm water pressure w for most of the night- I will set it to 10 cm water but I am not sure she can get an EPR setting. If no EPR setting is possible on travel CPAP, will set to 8 cm water.     My Plan is to proceed with:  Use the travel CPAP under new pressures as above:  Sleep habits discussed and educated.  Trazodone refilled. 50 mg at nighttime.  RLS improved under iron supplement.   I would like to thank Ginger Organ., Md [address] [place],  [place] [address] for allowing me to meet with and to take care of this pleasant patient.   In short, Candace Medina is presenting with fragmented and Non- refreshing sleep and snores loudly,  I plan to follow up  through our NP within 9 month.   CC: I will share my notes with PCP    Electronically signed by: Asencion Partridge  Ladan Vanderzanden, MD 06/24/2021 10:03 AM  [facility] and [employer] certified by [employer] of Sleep Medicine and Diplomate of the [employer] of Sleep Medicine. Board certified In Neurology through the [place], Fellow of the [employer] of Neurology. Medical Director of [employer].

## 2021-06-24 NOTE — Patient Instructions (Signed)
CPAP and BPAP Information CPAP and BPAP (also called BiPAP) are methods that use air pressure to keep your airways open and to help you breathe well. CPAP and BPAP use different amounts of pressure. Your health care provider will tell you whether CPAP or BPAP would be more helpful for you. CPAP stands for "continuous positive airway pressure." With CPAP, the amount of pressure stays the same while you breathe in (inhale) and out (exhale). BPAP stands for "bi-level positive airway pressure." With BPAP, the amount of pressure will be higher when you inhale and lower when you exhale. This allows you to take larger breaths. CPAP or BPAP may be used in the hospital, or your health care provider may want you to use it at home. You may need to have a sleep study before your health care provider can order a machine for you to use at home. What are the advantages? CPAP or BPAP can be helpful if you have: Sleep apnea. Chronic obstructive pulmonary disease (COPD). Heart failure. Medical conditions that cause muscle weakness, including muscular dystrophy or amyotrophic lateral sclerosis (ALS). Other problems that cause breathing to be shallow, weak, abnormal, or difficult. CPAP and BPAP are most commonly used for obstructive sleep apnea (OSA) to keep the airways from collapsing when the muscles relax during sleep. What are the risks? Generally, this is a safe treatment. However, problems may occur, including: Irritated skin or skin sores if the mask does not fit properly. Dry or stuffy nose or nosebleeds. Dry mouth. Feeling gassy or bloated. Sinus or lung infection if the equipment is not cleaned properly. When should CPAP or BPAP be used? In most cases, the mask only needs to be worn during sleep. Generally, the mask needs to be worn throughout the night and during any daytime naps. People with certain medical conditions may also need to wear the mask at other times, such as when they are awake. Follow  instructions from your health care provider about when to use the machine. What happens during CPAP or BPAP? Both CPAP and BPAP are provided by a small machine with a flexible plastic tube that attaches to a plastic mask that you wear. Air is blown through the mask into your nose or mouth. The amount of pressure that is used to blow the air can be adjusted on the machine. Your health care provider will set the pressure setting and help you find the best mask for you. Tips for using the mask Because the mask needs to be snug, some people feel trapped or closed-in (claustrophobic) when first using the mask. If you feel this way, you may need to get used to the mask. One way to do this is to hold the mask loosely over your nose or mouth and then gradually apply the mask more snugly. You can also gradually increase the amount of time that you use the mask. Masks are available in various types and sizes. If your mask does not fit well, talk with your health care provider about getting a different one. Some common types of masks include: Full face masks, which fit over the mouth and nose. Nasal masks, which fit over the nose. Nasal pillow or prong masks, which fit into the nostrils. If you are using a mask that fits over your nose and you tend to breathe through your mouth, a chin strap may be applied to help keep your mouth closed. Use a skin barrier to protect your skin as told by your health care provider.   Some CPAP and BPAP machines have alarms that may sound if the mask comes off or develops a leak. If you have trouble with the mask, it is very important that you talk with your health care provider about finding a way to make the mask easier to tolerate. Do not stop using the mask. There could be a negative impact on your health if you stop using the mask. Tips for using the machine Place your CPAP or BPAP machine on a secure table or stand near an electrical outlet. Know where the on/off switch is on  the machine. Follow instructions from your health care provider about how to set the pressure on your machine and when you should use it. Do not eat or drink while the CPAP or BPAP machine is on. Food or fluids could get pushed into your lungs by the pressure of the CPAP or BPAP. For home use, CPAP and BPAP machines can be rented or purchased through home health care companies. Many different brands of machines are available. Renting a machine before purchasing may help you find out which particular machine works well for you. Your health insurance company may also decide which machine you may get. Keep the CPAP or BPAP machine and attachments clean. Ask your health care provider for specific instructions. Check the humidifier if you have a dry stuffy nose or nosebleeds. Make sure it is working correctly. Follow these instructions at home: Take over-the-counter and prescription medicines only as told by your health care provider. Ask if you can take sinus medicine if your sinuses are blocked. Do not use any products that contain nicotine or tobacco. These products include cigarettes, chewing tobacco, and vaping devices, such as e-cigarettes. If you need help quitting, ask your health care provider. Keep all follow-up visits. This is important. Contact a health care provider if: You have redness or pressure sores on your head, face, mouth, or nose from the mask or head gear. You have trouble using the CPAP or BPAP machine. You cannot tolerate wearing the CPAP or BPAP mask. Someone tells you that you snore even when wearing your CPAP or BPAP. Get help right away if: You have trouble breathing. You feel confused. Summary CPAP and BPAP are methods that use air pressure to keep your airways open and to help you breathe well. If you have trouble with the mask, it is very important that you talk with your health care provider about finding a way to make the mask easier to tolerate. Do not stop using the  mask. There could be a negative impact to your health if you stop using the mask. Follow instructions from your health care provider about when to use the machine. This information is not intended to replace advice given to you by your health care provider. Make sure you discuss any questions you have with your health care provider. Document Revised: 08/07/2020 Document Reviewed: 08/07/2020 Elsevier Patient Education  2022 [employer]. Restless Legs Syndrome Restless legs syndrome is a condition that causes uncomfortable feelings or sensations in the legs, especially while sitting or lying down. The sensations usually cause an overwhelming urge to move the legs. The arms can also sometimes be affected. The condition can range from mild to severe. The symptoms often interfere with a person's ability to sleep. What are the causes? The cause of this condition is not known. What increases the risk? The following factors may make you more likely to develop this condition: Being older than 50. Pregnancy. Being  a woman. In general, the condition is more common in women than in men. A family history of the condition. Having iron deficiency. Overuse of caffeine, nicotine, or alcohol. Certain medical conditions, such as kidney disease, Parkinson's disease, or nerve damage. Certain medicines, such as those for high blood pressure, nausea, colds, allergies, depression, and some heart conditions. What are the signs or symptoms? The main symptom of this condition is uncomfortable sensations in the legs, such as: Pulling. Tingling. Prickling. Throbbing. Crawling. Burning. Usually, the sensations: Affect both sides of the body. Are worse when you sit or lie down. Are worse at night. These may wake you up or make it difficult to fall asleep. Make you have a strong urge to move your legs. Are temporarily relieved by moving your legs. The arms can also be affected, but this is rare. People who have  this condition often have tiredness during the day because of their lack of sleep at night. How is this diagnosed? This condition may be diagnosed based on: Your symptoms. Blood tests. In some cases, you may be monitored in a sleep lab by a specialist (a sleep study). This can detect any disruptions in your sleep. How is this treated? This condition is treated by managing the symptoms. This may include: Lifestyle changes, such as exercising, using relaxation techniques, and avoiding caffeine, alcohol, or tobacco. Medicines. Anti-seizure medicines may be tried first. Follow these instructions at home: General instructions Take over-the-counter and prescription medicines only as told by your health care provider. Use methods to help relieve the uncomfortable sensations, such as: Massaging your legs. Walking or stretching. Taking a cold or hot bath. Keep all follow-up visits as told by your health care provider. This is important. Lifestyle   Practice good sleep habits. For example, go to bed and get up at the same time every day. Most adults should get 7-9 hours of sleep each night. Exercise regularly. Try to get at least 30 minutes of exercise most days of the week. Practice ways of relaxing, such as yoga or meditation. Avoid caffeine and alcohol. Do not use any products that contain nicotine or tobacco, such as cigarettes and e-cigarettes. If you need help quitting, ask your health care provider. Contact a health care provider if: Your symptoms get worse or they do not improve with treatment. Summary Restless legs syndrome is a condition that causes uncomfortable feelings or sensations in the legs, especially while sitting or lying down. The symptoms often interfere with a person's ability to sleep. This condition is treated by managing the symptoms. You may need to make lifestyle changes or take medicines. This information is not intended to replace advice given to you by your health  care provider. Make sure you discuss any questions you have with your health care provider. Document Revised: 10/18/2019 Document Reviewed: 09/18/2017 Elsevier Patient Education  2022 [employer].

## 2021-07-08 DIAGNOSIS — R61 Generalized hyperhidrosis: Secondary | ICD-10-CM | POA: Diagnosis not present

## 2021-07-08 DIAGNOSIS — J441 Chronic obstructive pulmonary disease with (acute) exacerbation: Secondary | ICD-10-CM | POA: Diagnosis not present

## 2021-07-08 DIAGNOSIS — R058 Other specified cough: Secondary | ICD-10-CM | POA: Diagnosis not present

## 2021-08-04 DIAGNOSIS — G4733 Obstructive sleep apnea (adult) (pediatric): Secondary | ICD-10-CM | POA: Diagnosis not present

## 2021-09-03 DIAGNOSIS — G4733 Obstructive sleep apnea (adult) (pediatric): Secondary | ICD-10-CM | POA: Diagnosis not present

## 2021-10-04 DIAGNOSIS — G4733 Obstructive sleep apnea (adult) (pediatric): Secondary | ICD-10-CM | POA: Diagnosis not present

## 2021-10-07 DIAGNOSIS — G4733 Obstructive sleep apnea (adult) (pediatric): Secondary | ICD-10-CM | POA: Diagnosis not present

## 2021-10-26 ENCOUNTER — Ambulatory Visit (HOSPITAL_BASED_OUTPATIENT_CLINIC_OR_DEPARTMENT_OTHER): Payer: Federal, State, Local not specified - PPO

## 2021-10-26 ENCOUNTER — Ambulatory Visit (HOSPITAL_BASED_OUTPATIENT_CLINIC_OR_DEPARTMENT_OTHER)
Admission: RE | Admit: 2021-10-26 | Discharge: 2021-10-26 | Disposition: A | Discharge status: Home / Self Care | Payer: Federal, State, Local not specified - PPO | Source: Ambulatory Visit | Attending: Acute Care | Admitting: Acute Care

## 2021-10-26 ENCOUNTER — Other Ambulatory Visit: Payer: Self-pay

## 2021-10-26 DIAGNOSIS — F17210 Nicotine dependence, cigarettes, uncomplicated: Secondary | ICD-10-CM | POA: Diagnosis not present

## 2021-10-26 DIAGNOSIS — Z87891 Personal history of nicotine dependence: Secondary | ICD-10-CM | POA: Diagnosis not present

## 2021-10-27 ENCOUNTER — Telehealth: Payer: Self-pay | Admitting: Acute Care

## 2021-10-27 NOTE — Telephone Encounter (Signed)
I have called the patient with thee results of the low dose CT she had done 2/14. It was read today. Lung  RADS 3, nodules that are probably benign findings, short term follow up suggested: includes nodules with a low likelihood of becoming a clinically active cancer. Radiology recommends a 6 month repeat LDCT follow up. She has a new 4.6 mm right upper lobe nodule. She is in agreement with a 6 month follow up. She understands she will get a call closer to the time to schedule the follow up scan 04/2022. Langley Gauss, please fax results to PCP and place order for a 6 month follow up scan.

## 2021-10-27 NOTE — Telephone Encounter (Signed)
Called and spoke with patient and she is wanting the results for her CT scan. Told patient I would forward message to Judson Roch and get back with her regarding the results.  Sarah please advise

## 2021-10-28 ENCOUNTER — Other Ambulatory Visit: Payer: Self-pay

## 2021-10-28 DIAGNOSIS — R911 Solitary pulmonary nodule: Secondary | ICD-10-CM

## 2021-10-28 DIAGNOSIS — F17210 Nicotine dependence, cigarettes, uncomplicated: Secondary | ICD-10-CM

## 2021-10-28 NOTE — Telephone Encounter (Signed)
Order placed for 6 month follow up CT and results faxed to PCP with plan

## 2021-10-30 ENCOUNTER — Telehealth (HOSPITAL_BASED_OUTPATIENT_CLINIC_OR_DEPARTMENT_OTHER): Payer: Self-pay

## 2021-11-02 ENCOUNTER — Encounter: Payer: Self-pay | Admitting: Acute Care

## 2021-11-02 ENCOUNTER — Other Ambulatory Visit: Payer: Self-pay

## 2021-11-02 ENCOUNTER — Encounter: Payer: Federal, State, Local not specified - PPO | Admitting: *Deleted

## 2021-11-02 DIAGNOSIS — Z006 Encounter for examination for normal comparison and control in clinical research program: Secondary | ICD-10-CM

## 2021-11-02 DIAGNOSIS — R911 Solitary pulmonary nodule: Secondary | ICD-10-CM

## 2021-11-02 DIAGNOSIS — M8580 Other specified disorders of bone density and structure, unspecified site: Secondary | ICD-10-CM | POA: Diagnosis not present

## 2021-11-02 NOTE — Research (Signed)
Title: [place]: CliNIcal Utility of ManaGement of Patients witH CT and LDCT Identified Pulmonary Nodules UsinG the Percepta NasAL Swab ClassifiEr -- with Familiarization   Protocol #: [id number] Sponsor: [employer]   Protocol Revision 1 dated 01Sep2022 and confirmed current on today's visit, IRB approved Revision 1 on 29Dec2022.   Objectives:  Primary: To evaluate if use of the Percepta Nasal Swab test in the diagnostic work up of newly identified pulmonary nodules reduces the number of invasive procedures in the group classified as low-risk by the test and that are benign as compared to a control group managed without a Percepta Nasal Swab test result.                   A newly identified nodule is defined as any nodule first identified on imaging                   <90 days prior to nasal sample collection that hasnt undergone a diagnostic                    procedure for the management of their index nodule prior to enrollment.                   CT imaging includes conventional CT, LDCT, HRCT                   Benign diagnosis is defined as a specific diagnosis of a benign condition,                    radiographic resolution or stability at ? 24 months, or no cytological,                    radiological, or pathological evidence of cancer.                   Procedures will be categorized as either invasive or non-invasive in the Data                    Management Plan (DMP). Secondary: To evaluate if use of the Percepta Nasal Swab test in the diagnostic work up of newly identified pulmonary nodules increases the proportion of subjects classified as high-risk by the test and have primary lung cancer that go directly to appropriate therapy as compared to a control group managed without a Percepta Nasal Swab test result.                    Proportion of subjects that go directly to appropriate therapy is defined as those                     subjects that undergo surgery, ablative  or other appropriate therapy as the next                     step after the Percepta Nasal Swab test result without intervening non-surgical                     procedures                             a. Non-surgical procedures include diagnostic PET, but not PET for  staging purposes.                             b. Appropriate therapies will be defined in the CRF.                    A newly identified nodule is defined as any nodule first identified on imaging                    <90 days prior to nasal sample collection.                    Lung cancer diagnosis is defined as established by cytology or pathology, or in                     circumstances where a presumptive diagnosis of cancer led to definitive                     ablative or other appropriate therapy without pathology. Key Inclusion Criteria:  Inclusion Criteria:  Able to tolerate nasal epithelial specimen collection  Signed written Informed Consent obtained  Candace clinical history available for review by sponsor and regulatory agencies  New nodule first identified on imaging < 90 days prior to nasal sample collection (index nodule)  CT report available for index nodule  57 - 85 years of age  Current or former smoker (>100 cigarettes in a lifetime)  Pulmonary nodule ?30 mm detected by CT  Key Exclusion Criteria: Exclusion Criteria  Candace has undergone a diagnostic procedure for the management of their index nodule after the index CT and prior to enrollment  Active cancer (other than non-melanoma skin cancer)  Prior primary lung cancer (prior non-lung cancer acceptable)  Prior participation in this study (i.e., subjects may not be enrolled more than once)  Current active treatment with an investigational device or drug (patients in trial follow up period are okay if intervention phase is complete)  Patient enrolled or planned to be enrolled in another clinical trial that may influence  management of the patients nodule  Concurrent or planned use of tools or tests for assigning lung nodule risk of malignancy (e.g., genomic or proteomic blood tests) other than clinically validated risk calculators  Clinical Research Coordinator / Research RN note : This visit is for enrollment /baseline Candace [id number] DOB: [id number] on [id number] for the above protocol is an Enrollment Visit and is for purpose of research.    Candace expressed interest and consent in continuing as a study Candace. Candace confirmed contact information (e.g. address, telephone, email). Candace thanked for participation in research and contribution to science.     During this visit on 21FEB2023  , the Candace reviewed and signed the consent form, provided demographics, and had a nasal swab collected per the above referenced protocol. Please refer to the Candace's paper source binder for further details.   The PI and Sub-I met/discussed the Candace prior to consenting patient.  The sub-Investigator  [name], NP was present for the consenting process.         Signed by Jaye Beagle RN, CRN II

## 2021-11-11 DIAGNOSIS — E1149 Type 2 diabetes mellitus with other diabetic neurological complication: Secondary | ICD-10-CM | POA: Diagnosis not present

## 2021-11-11 DIAGNOSIS — E559 Vitamin D deficiency, unspecified: Secondary | ICD-10-CM | POA: Diagnosis not present

## 2021-11-18 DIAGNOSIS — D649 Anemia, unspecified: Secondary | ICD-10-CM | POA: Diagnosis not present

## 2021-11-18 DIAGNOSIS — Z1331 Encounter for screening for depression: Secondary | ICD-10-CM | POA: Diagnosis not present

## 2021-11-18 DIAGNOSIS — Z0000 Encounter for general adult medical examination without abnormal findings: Secondary | ICD-10-CM | POA: Diagnosis not present

## 2021-11-18 DIAGNOSIS — Z1339 Encounter for screening examination for other mental health and behavioral disorders: Secondary | ICD-10-CM | POA: Diagnosis not present

## 2021-11-18 DIAGNOSIS — E1149 Type 2 diabetes mellitus with other diabetic neurological complication: Secondary | ICD-10-CM | POA: Diagnosis not present

## 2021-11-18 DIAGNOSIS — R82998 Other abnormal findings in urine: Secondary | ICD-10-CM | POA: Diagnosis not present

## 2021-12-14 DIAGNOSIS — L409 Psoriasis, unspecified: Secondary | ICD-10-CM | POA: Diagnosis not present

## 2021-12-14 DIAGNOSIS — Z79899 Other long term (current) drug therapy: Secondary | ICD-10-CM | POA: Diagnosis not present

## 2021-12-14 DIAGNOSIS — M1991 Primary osteoarthritis, unspecified site: Secondary | ICD-10-CM | POA: Diagnosis not present

## 2021-12-14 DIAGNOSIS — R5383 Other fatigue: Secondary | ICD-10-CM | POA: Diagnosis not present

## 2021-12-14 DIAGNOSIS — R21 Rash and other nonspecific skin eruption: Secondary | ICD-10-CM | POA: Diagnosis not present

## 2021-12-14 DIAGNOSIS — M450 Ankylosing spondylitis of multiple sites in spine: Secondary | ICD-10-CM | POA: Diagnosis not present

## 2021-12-20 DIAGNOSIS — J0100 Acute maxillary sinusitis, unspecified: Secondary | ICD-10-CM | POA: Diagnosis not present

## 2021-12-20 DIAGNOSIS — R051 Acute cough: Secondary | ICD-10-CM | POA: Diagnosis not present

## 2021-12-20 DIAGNOSIS — J449 Chronic obstructive pulmonary disease, unspecified: Secondary | ICD-10-CM | POA: Diagnosis not present

## 2022-01-05 DIAGNOSIS — G4733 Obstructive sleep apnea (adult) (pediatric): Secondary | ICD-10-CM | POA: Diagnosis not present

## 2022-01-13 NOTE — Patient Instructions (Incomplete)
Please continue using your CPAP regularly. While your insurance requires that you use CPAP at least 4 hours each night on 70% of the nights, I recommend, that you not skip any nights and use it throughout the night if you can. Getting used to CPAP and staying with the treatment long term does take time and patience and discipline. Untreated obstructive sleep apnea when it is moderate to severe can have an adverse impact on cardiovascular health and raise her risk for heart disease, arrhythmias, hypertension, congestive heart failure, stroke and diabetes. Untreated obstructive sleep apnea causes sleep disruption, nonrestorative sleep, and sleep deprivation. This can have an impact on your day to day functioning and cause daytime sleepiness and impairment of cognitive function, memory loss, mood disturbance, and problems focussing. Using CPAP regularly can improve these symptoms. ? ?Continue trazodone '50mg'$  every night. Continue good sleep hygiene practices.  ? ? ?Follow up in  ?

## 2022-01-13 NOTE — Progress Notes (Deleted)
PATIENT: Candace Medina DOB: 1956/04/22  REASON FOR VISIT: follow up HISTORY FROM: patient  No chief complaint on file.    HISTORY OF PRESENT ILLNESS:  01/13/22 ALL:  Candace Medina is a 66 y.o. female here today for follow up for OSA on CPAP and insomnia.    She continues trazodone '50mg'$  QHS.   Travel machine?    HISTORY: (copied from Dr Dohmeier's previous note)  Candace Medina is a 66 y.o.  Caucasian female patient seen here in a RV on 06/24/2021 : The patient was invited for a sleep study in April 2022 took place on 4-19 2022 and confirmed the patient had obstructive sleep apnea with prolonged hypoxemia most of her apneic events were actually hypopneas so qualified as shallow breathing rather than frank apnea.  The patient qualified as mild sleep apnea at 12.4 AHI overall there was no REM sleep dependency, in supine sleep her AHI was 26.2 so avoiding sleeping on the back could be very helpful.  Her oxygen nadir was 84% but the time spent in hypoxia was 84 minutes.  There was significant contribution during REM sleep noted.  We asked her to return for an attended CPAP titration.  This took place on 6-29 2022 also we wanted to have the opportunity to give oxygen if needed.  CPAP was initiated on the ResMed nasal mask and 20 and medium sized and was titrated from 5-8 cmH2O but the AHI became 0.0.  REM sleep rebounded to almost 40% of the night.  She could not recall that she was dreaming anything for a long time.  The AHI was significantly reduced and we ordered an auto CPAP with a setting between 6 and 10 cmH2O no EPR heated humidification and the N 20 ResMed mask.  She had responded very well to 8 cm water pressure.  The patient then was actually finished with an auto titration device which she later exchanged for a travel CPAP.  The auto titration however she had used through the months of August through October and it showed 100% compliant user time with an average of 7 hours and  1 minute.  Her AutoSet was actually given with a 3 cm EPR also I had ordered no EPR.  The residual AHI was 1.9 the 95th percentile pressure was 9.8 cmH2O and her residual apneas were vastly obstructive and not central in origin.   There were some higher air leaks with a nasal mask the 95th percentile air leak was qualified at 36.5 L/min.  She has been given a full facemask by choice but stated that the facial mask feels too small.  She was yesterday refitted to a medium size mask.    We need to set the travel machine now: and she used 10 cm water pressure w for most of the night- I will set it to 10 cm water but I am not sure she can get an EPR setting. If no EPR setting is possible on travel CPAP, will set to 8 cm water.    Her Migraines have improved her hypersomnia is still there, but is better.    11-26-2020 Initial referral from Dr Brigitte Pulse for an evaluation of poor sleep quality, fragmented sleep, non refreshing sleep    Chief concern according to patient :    Candace Medina has a past medical history of Abnormal Pap smear of cervix, Ankylosing spondylitis ([place]), Anxiety and depression, Arthritis, Asthma, Colon polyps, COPD (chronic obstructive pulmonary disease) ([place]), Diabetes  mellitus without complication ([place]), Endometriosis, UARS,  COPD < GERD (gastroesophageal reflux disease), colonic polyps (12/30/2014), Hyperlipidemia, Internal hemorrhoids with prolapse - Gr 2 (12/17/2014), Ocular Migraines with visual aura, Nicotine addiction, Osteopenia, Osteoporosis, Plantar fasciitis, Urinary stress incontinence  (05/17/2018), and Vitamin D deficiency.    The patient had the first sleep study more than 10 years ago and was told she snored loudly but had no OSA. Sleep relevant medical history:  Esophagial dilation with GI - Barretts oesophagus.    Tonsillectomy in childhood, smoker with COPD.     Family medical /sleep history: no other family member on CPAP with OSA, insomnia, sleep walkers.    Social  history:  Patient is retired from the Writer and retired due to fatigue, and lives in a household with spouse and son ( recovering addict 49) - and one dog.  The couple has  2 children, 3 grandchildren.    Tobacco use; 1.5 p /pd.  ETOH use/ none ,  Caffeine intake in form of Coffee( 4 cups in AM ) Soda( every other day 1 glass) Tea ( when eating out) , but no energy drinks. Regular exercise in form of swimming. Seasonal.    Sleep habits are as follows: The patient's dinner time is before 7 PM. On Ozempic- The patient goes to bed at 11 PM and is asleep by midnight, bedroom is cool, not quiet and dark. Has a TV on.  She reads on a phone-once asleep she  continues to sleep for intervals of 2-3  hours, but wakes from hip pain- bone pain, joint pain- not bathroom breaks, se may get two portions of 2-3 hours and another periodof sleep in the later morning.   The preferred sleep position is side ways- right side-, with the support of 2 pillows. Neck pain - she uses a specal pillow.  Dreams are reportedly rare. 5-6 AM is the usual rise time. The patient wakes up spontaneously. But she goes back to be at 10-12  She reports not feeling refreshed or restored in AM, with symptoms such as dry mouth, joint pain, neck pain,   and residual fatigue. Naps are taken frequently, lasting from 1-3 hours a day and are more refreshing than nocturnal sleep.    REVIEW OF SYSTEMS: Out of a complete 14 system review of symptoms, the patient complains only of the following symptoms, and all other reviewed systems are negative.  ESS:  ALLERGIES: Allergies  Allergen Reactions   Other Rash and Other (See Comments)    mosquitoes   Venomil Honey Bee Venom [Honey Bee Venom] Anaphylaxis    ALL VENOMOUS INSECTS    Boniva [Ibandronic Acid]     Couldn't walk   Keflex [Cephalexin] Itching   Neosporin [Neomycin-Bacitracin Zn-Polymyx]    Tramadol    Codeine Rash   Morphine And Related Rash    HOME  MEDICATIONS: Outpatient Medications Prior to Visit  Medication Sig Dispense Refill   albuterol (PROVENTIL HFA;VENTOLIN HFA) 108 (90 BASE) MCG/ACT inhaler Inhale into the lungs every 6 (six) hours as needed for wheezing or shortness of breath.     ALPRAZolam (XANAX) 0.5 MG tablet Take 0.5 mg by mouth at bedtime as needed for anxiety or sleep.      budesonide-formoterol (SYMBICORT) 80-4.5 MCG/ACT inhaler Inhale 2 puffs into the lungs 2 (two) times daily.     desvenlafaxine (PRISTIQ) 50 MG 24 hr tablet Take 50 mg by mouth daily.     EPINEPHrine 0.3 mg/0.3 mL IJ SOAJ  injection Inject 0.3 mg into the muscle as needed for anaphylaxis.  1   ergocalciferol (VITAMIN D2) 50000 UNITS capsule Take 50,000 Units by mouth 2 (two) times a week.      fluticasone (FLONASE) 50 MCG/ACT nasal spray Place 1 spray into the nose daily as needed for allergies or rhinitis.      metFORMIN (GLUCOPHAGE) 1000 MG tablet Take 1,000 mg by mouth 2 (two) times daily with a meal.     MYRBETRIQ 25 MG TB24 tablet Take 25 mg by mouth daily.     OZEMPIC, 1 MG/DOSE, 4 MG/3ML SOPN INJECT 1 MG UNDER THE SKIN ONCE A WEEK     pantoprazole (PROTONIX) 40 MG tablet Take 40 mg by mouth daily.     PROLIA 60 MG/ML SOSY injection      propranolol ER (INDERAL LA) 60 MG 24 hr capsule Take 60 mg by mouth daily.     RESTASIS 0.05 % ophthalmic emulsion 1 drop 2 (two) times daily.     rosuvastatin (CRESTOR) 10 MG tablet Take 10 mg by mouth daily.     Secukinumab (COSENTYX SENSOREADY PEN) 150 MG/ML SOAJ Cosentyx Pen 150 mg/mL subcutaneous     traZODone (DESYREL) 50 MG tablet TAKE 1 TABLET(50 MG) BY MOUTH AT BEDTIME 90 tablet 2   Facility-Administered Medications Prior to Visit  Medication Dose Route Frequency Provider Last Rate Last Admin   0.9 %  sodium chloride infusion  500 mL Intravenous Once Gatha Mayer, MD        PAST MEDICAL HISTORY: Past Medical History:  Diagnosis Date   Abnormal Pap smear of cervix    Ankylosing spondylitis ([place])     Anxiety and depression    Arthritis    Asthma    Colon polyps    COPD (chronic obstructive pulmonary disease) ([place])    Diabetes mellitus without complication ([place])    Endometriosis    GERD (gastroesophageal reflux disease)    Hx of colonic polyps 12/30/2014   Hyperlipidemia    Internal hemorrhoids with prolapse - Gr 2 12/17/2014   Migraines    Nicotine addiction    Osteopenia    Osteoporosis    Plantar fasciitis    Urinary and fecal incontinence 05/17/2018   Vitamin D deficiency     PAST SURGICAL HISTORY: Past Surgical History:  Procedure Laterality Date   COLONOSCOPY     CRYOTHERAPY     for abnormal pap smear   HEMORRHOID BANDING  02/2015   LAPAROSCOPIC CHOLECYSTECTOMY  09/2017   Dr. Georgette Dover   SHOULDER ARTHROSCOPY Right    bursitis   SHOULDER SURGERY Left    TONSILLECTOMY     TOTAL ABDOMINAL HYSTERECTOMY  1986   UPPER GASTROINTESTINAL ENDOSCOPY      FAMILY HISTORY: Family History  Problem Relation Age of Onset   Bladder Cancer Father    Esophageal cancer Mother    Melanoma Brother    Heart disease Other        mother's entire family-13 Bro and Sis   Brain cancer Paternal Aunt    Lung cancer Paternal Uncle    Bone cancer Paternal Uncle    Lung cancer Paternal Aunt    Stroke Maternal Grandmother    Diabetes Paternal Grandmother    Leukemia Paternal Aunt     SOCIAL HISTORY: Social History   Socioeconomic History   Marital status: Married    Spouse name: Not on file   Number of children: 2   Years of education: Not on file  Highest education level: Not on file  Occupational History   Occupation: [employer]  Tobacco Use   Smoking status: Every Day    Packs/day: 2.00    Years: 43.00    Pack years: 86.00    Types: Cigarettes   Smokeless tobacco: Never   Tobacco comments:    stopped today 09-19-2019  Substance and Sexual Activity   Alcohol use: No    Alcohol/week: 0.0 standard drinks   Drug use: No   Sexual activity: Not Currently    Birth  control/protection: Surgical    Comment: hysterectomy  Other Topics Concern   Not on file  Social History Narrative   She is married, she has 1 son and one daughter   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 There is no height or weight on file to calculate BMI.  Generalized: Well developed, in no acute distress  Cardiology: normal rate and rhythm, no murmur noted Respiratory: clear to auscultation bilaterally  Neurological examination  Mentation: Alert oriented to time, place, history taking. Follows all commands speech and language fluent Cranial nerve II-XII: Pupils were equal round reactive to light. Extraocular movements were full, visual field were full  Motor: The motor testing reveals 5 over 5 strength of all 4 extremities. Good symmetric motor tone is noted throughout.  Gait and station: Gait is normal.    DIAGNOSTIC DATA (LABS, IMAGING, TESTING) - I reviewed patient records, labs, notes, testing and imaging myself where available.      View : No data to display.           Lab Results  Component Value Date   WBC 8.6 01/24/2018   HGB 14.2 01/24/2018   HCT 43.0 01/24/2018   MCV 86.0 01/24/2018   PLT 300 01/24/2018      Component Value Date/Time   NA 140 01/24/2018 1610   K 3.7 01/24/2018 1610   CL 106 01/24/2018 1610   CO2 23 01/24/2018 1610   GLUCOSE 169 (H) 01/24/2018 1610   BUN 13 01/24/2018 1610   CREATININE 0.95 01/24/2018 1610   CALCIUM 9.5 01/24/2018 1610    GFRNONAA >60 01/24/2018 1610   GFRAA >60 01/24/2018 1610   No results found for: CHOL, HDL, LDLCALC, LDLDIRECT, TRIG, CHOLHDL No results found for: HGBA1C No results found for: VITAMINB12 No results found for: TSH   ASSESSMENT AND PLAN 66 y.o. year old female  has a past medical history of Abnormal Pap smear of cervix, Ankylosing spondylitis ([place]), Anxiety and depression, Arthritis, Asthma, Colon polyps, COPD (chronic obstructive pulmonary disease) ([place]), Diabetes mellitus without complication ([place]), Endometriosis, GERD (gastroesophageal reflux disease), colonic polyps (12/30/2014), Hyperlipidemia, Internal hemorrhoids with prolapse - Gr 2 (12/17/2014), Migraines, Nicotine addiction, Osteopenia, Osteoporosis, Plantar fasciitis, Urinary and fecal incontinence (05/17/2018), and Vitamin D deficiency. here with   No diagnosis found.    Candace Medina is doing well on CPAP therapy. Compliance report reveals ***. *** was encouraged  to continue using CPAP nightly and for greater than 4 hours each night. We will update supply orders as indicated. Risks of untreated sleep apnea review and education materials provided. Healthy lifestyle habits encouraged. *** will follow up in ***, sooner if needed. *** verbalizes understanding and agreement with this plan.    No orders of the defined types were placed in this encounter.    No orders of the defined types were placed in this encounter.     Debbora Presto, FNP-C 01/13/2022, 10:01 AM [facility] [address] [place], [place] [address] [phone]

## 2022-01-17 ENCOUNTER — Ambulatory Visit: Payer: Federal, State, Local not specified - PPO | Admitting: Family Medicine

## 2022-01-17 DIAGNOSIS — J449 Chronic obstructive pulmonary disease, unspecified: Secondary | ICD-10-CM

## 2022-01-17 DIAGNOSIS — Z9989 Dependence on other enabling machines and devices: Secondary | ICD-10-CM

## 2022-01-20 DIAGNOSIS — H2513 Age-related nuclear cataract, bilateral: Secondary | ICD-10-CM | POA: Diagnosis not present

## 2022-01-20 DIAGNOSIS — E119 Type 2 diabetes mellitus without complications: Secondary | ICD-10-CM | POA: Diagnosis not present

## 2022-02-04 DIAGNOSIS — G4733 Obstructive sleep apnea (adult) (pediatric): Secondary | ICD-10-CM | POA: Diagnosis not present

## 2022-02-17 DIAGNOSIS — H2513 Age-related nuclear cataract, bilateral: Secondary | ICD-10-CM | POA: Diagnosis not present

## 2022-02-17 DIAGNOSIS — H04123 Dry eye syndrome of bilateral lacrimal glands: Secondary | ICD-10-CM | POA: Diagnosis not present

## 2022-02-17 DIAGNOSIS — H2512 Age-related nuclear cataract, left eye: Secondary | ICD-10-CM | POA: Diagnosis not present

## 2022-03-01 DIAGNOSIS — K760 Fatty (change of) liver, not elsewhere classified: Secondary | ICD-10-CM | POA: Diagnosis not present

## 2022-03-01 DIAGNOSIS — E119 Type 2 diabetes mellitus without complications: Secondary | ICD-10-CM | POA: Diagnosis not present

## 2022-03-01 DIAGNOSIS — H2512 Age-related nuclear cataract, left eye: Secondary | ICD-10-CM | POA: Diagnosis not present

## 2022-03-01 DIAGNOSIS — G4730 Sleep apnea, unspecified: Secondary | ICD-10-CM | POA: Diagnosis not present

## 2022-03-01 DIAGNOSIS — E785 Hyperlipidemia, unspecified: Secondary | ICD-10-CM | POA: Diagnosis not present

## 2022-03-01 DIAGNOSIS — J449 Chronic obstructive pulmonary disease, unspecified: Secondary | ICD-10-CM | POA: Diagnosis not present

## 2022-03-02 DIAGNOSIS — H2511 Age-related nuclear cataract, right eye: Secondary | ICD-10-CM | POA: Diagnosis not present

## 2022-03-03 ENCOUNTER — Other Ambulatory Visit: Payer: Self-pay | Admitting: Neurology

## 2022-03-07 DIAGNOSIS — G4733 Obstructive sleep apnea (adult) (pediatric): Secondary | ICD-10-CM | POA: Diagnosis not present

## 2022-03-21 DIAGNOSIS — E785 Hyperlipidemia, unspecified: Secondary | ICD-10-CM | POA: Diagnosis not present

## 2022-03-21 DIAGNOSIS — K219 Gastro-esophageal reflux disease without esophagitis: Secondary | ICD-10-CM | POA: Diagnosis not present

## 2022-03-21 DIAGNOSIS — J449 Chronic obstructive pulmonary disease, unspecified: Secondary | ICD-10-CM | POA: Diagnosis not present

## 2022-03-21 DIAGNOSIS — Z885 Allergy status to narcotic agent status: Secondary | ICD-10-CM | POA: Diagnosis not present

## 2022-03-21 DIAGNOSIS — K760 Fatty (change of) liver, not elsewhere classified: Secondary | ICD-10-CM | POA: Diagnosis not present

## 2022-03-21 DIAGNOSIS — E119 Type 2 diabetes mellitus without complications: Secondary | ICD-10-CM | POA: Diagnosis not present

## 2022-03-21 DIAGNOSIS — H2511 Age-related nuclear cataract, right eye: Secondary | ICD-10-CM | POA: Diagnosis not present

## 2022-03-21 DIAGNOSIS — M2460 Ankylosis, unspecified joint: Secondary | ICD-10-CM | POA: Diagnosis not present

## 2022-03-21 DIAGNOSIS — Z881 Allergy status to other antibiotic agents status: Secondary | ICD-10-CM | POA: Diagnosis not present

## 2022-03-21 DIAGNOSIS — M4690 Unspecified inflammatory spondylopathy, site unspecified: Secondary | ICD-10-CM | POA: Diagnosis not present

## 2022-04-05 DIAGNOSIS — G4733 Obstructive sleep apnea (adult) (pediatric): Secondary | ICD-10-CM | POA: Diagnosis not present

## 2022-05-11 NOTE — Progress Notes (Unsigned)
PATIENT: Candace Medina DOB: [birth year]  REASON FOR VISIT: follow up HISTORY FROM: patient  No chief complaint on file.    HISTORY OF PRESENT ILLNESS:  05/11/22 ALL:  Candace Medina is a 66 y.o. female here today for follow up for OSA on CPAP.      HISTORY: (copied from Dr Lurline Del previous note)  Candace Medina is a 66 y.o.  Caucasian female patient seen here in a RV on 06/24/2021 : The patient was invited for a sleep study in April 2022 took place on 4-19 2022 and confirmed the patient had obstructive sleep apnea with prolonged hypoxemia most of her apneic events were actually hypopneas so qualified as shallow breathing rather than frank apnea.  The patient qualified as mild sleep apnea at 12.4 AHI overall there was no REM sleep dependency, in supine sleep her AHI was 26.2 so avoiding sleeping on the back could be very helpful.  Her oxygen nadir was 84% but the time spent in hypoxia was 84 minutes.  There was significant contribution during REM sleep noted.  We asked her to return for an attended CPAP titration.  This took place on 6-29 2022 also we wanted to have the opportunity to give oxygen if needed.  CPAP was initiated on the ResMed nasal mask and 20 and medium sized and was titrated from 5-8 cmH2O but the AHI became 0.0.  REM sleep rebounded to almost 40% of the night.  She could not recall that she was dreaming anything for a long time.  The AHI was significantly reduced and we ordered an auto CPAP with a setting between 6 and 10 cmH2O no EPR heated humidification and the N 20 ResMed mask.  She had responded very well to 8 cm water pressure.  The patient then was actually finished with an auto titration device which she later exchanged for a travel CPAP.  The auto titration however she had used through the months of August through October and it showed 100% compliant user time with an average of 7 hours and 1 minute.  Her AutoSet was actually given with a 3 cm EPR also I had  ordered no EPR.  The residual AHI was 1.9 the 95th percentile pressure was 9.8 cmH2O and her residual apneas were vastly obstructive and not central in origin.   There were some higher air leaks with a nasal mask the 95th percentile air leak was qualified at 36.5 L/min.  She has been given a full facemask by choice but stated that the facial mask feels too small.  She was yesterday refitted to a medium size mask.    We need to set the travel machine now: and she used 10 cm water pressure w for most of the night- I will set it to 10 cm water but I am not sure she can get an EPR setting. If no EPR setting is possible on travel CPAP, will set to 8 cm water.    Her Migraines have improved her hypersomnia is still there, but is better.    REVIEW OF SYSTEMS: Out of a complete 14 system review of symptoms, the patient complains only of the following symptoms, and all other reviewed systems are negative.  ESS:  ALLERGIES: Allergies  Allergen Reactions   Other Rash and Other (See Comments)    mosquitoes   Venomil Honey Bee Venom [Honey Bee Venom] Anaphylaxis    ALL VENOMOUS INSECTS    Boniva [Ibandronic Acid]  Couldn't walk   Keflex [Cephalexin] Itching   Neosporin [Neomycin-Bacitracin Zn-Polymyx]    Tramadol    Codeine Rash   Morphine And Related Rash    HOME MEDICATIONS: Outpatient Medications Prior to Visit  Medication Sig Dispense Refill   traZODone (DESYREL) 50 MG tablet TAKE 1 TABLET(50 MG) BY MOUTH AT BEDTIME 90 tablet 0   albuterol (PROVENTIL HFA;VENTOLIN HFA) 108 (90 BASE) MCG/ACT inhaler Inhale into the lungs every 6 (six) hours as needed for wheezing or shortness of breath.     ALPRAZolam (XANAX) 0.5 MG tablet Take 0.5 mg by mouth at bedtime as needed for anxiety or sleep.      budesonide-formoterol (SYMBICORT) 80-4.5 MCG/ACT inhaler Inhale 2 puffs into the lungs 2 (two) times daily.     desvenlafaxine (PRISTIQ) 50 MG 24 hr tablet Take 50 mg by mouth daily.     EPINEPHrine  0.3 mg/0.3 mL IJ SOAJ injection Inject 0.3 mg into the muscle as needed for anaphylaxis.  1   ergocalciferol (VITAMIN D2) 50000 UNITS capsule Take 50,000 Units by mouth 2 (two) times a week.      fluticasone (FLONASE) 50 MCG/ACT nasal spray Place 1 spray into the nose daily as needed for allergies or rhinitis.      metFORMIN (GLUCOPHAGE) 1000 MG tablet Take 1,000 mg by mouth 2 (two) times daily with a meal.     MYRBETRIQ 25 MG TB24 tablet Take 25 mg by mouth daily.     OZEMPIC, 1 MG/DOSE, 4 MG/3ML SOPN INJECT 1 MG UNDER THE SKIN ONCE A WEEK     pantoprazole (PROTONIX) 40 MG tablet Take 40 mg by mouth daily.     PROLIA 60 MG/ML SOSY injection      propranolol ER (INDERAL LA) 60 MG 24 hr capsule Take 60 mg by mouth daily.     RESTASIS 0.05 % ophthalmic emulsion 1 drop 2 (two) times daily.     rosuvastatin (CRESTOR) 10 MG tablet Take 10 mg by mouth daily.     Secukinumab (COSENTYX SENSOREADY PEN) 150 MG/ML SOAJ Cosentyx Pen 150 mg/mL subcutaneous     Facility-Administered Medications Prior to Visit  Medication Dose Route Frequency Provider Last Rate Last Admin   0.9 %  sodium chloride infusion  500 mL Intravenous Once Gatha Mayer, MD        PAST MEDICAL HISTORY: Past Medical History:  Diagnosis Date   Abnormal Pap smear of cervix    Ankylosing spondylitis ([place])    Anxiety and depression    Arthritis    Asthma    Colon polyps    COPD (chronic obstructive pulmonary disease) ([place])    Diabetes mellitus without complication ([place])    Endometriosis    GERD (gastroesophageal reflux disease)    Hx of colonic polyps 12/30/2014   Hyperlipidemia    Internal hemorrhoids with prolapse - Gr 2 12/17/2014   Migraines    Nicotine addiction    Osteopenia    Osteoporosis    Plantar fasciitis    Urinary and fecal incontinence 05/17/2018   Vitamin D deficiency     PAST SURGICAL HISTORY: Past Surgical History:  Procedure Laterality Date   COLONOSCOPY     CRYOTHERAPY     for abnormal pap smear    HEMORRHOID BANDING  02/2015   LAPAROSCOPIC CHOLECYSTECTOMY  09/2017   Dr. Georgette Dover   SHOULDER ARTHROSCOPY Right    bursitis   SHOULDER SURGERY Left    [place]  UPPER GASTROINTESTINAL ENDOSCOPY      FAMILY HISTORY: Family History  Problem Relation Age of Onset   Bladder Cancer Father    Esophageal cancer Mother    Melanoma Brother    Heart disease Other        mother's entire family-13 Bro and Sis   Brain cancer Paternal Aunt    Lung cancer Paternal Uncle    Bone cancer Paternal Uncle    Lung cancer Paternal Aunt    Stroke Maternal Grandmother    Diabetes Paternal Grandmother    Leukemia Paternal Aunt     SOCIAL HISTORY: Social History   Socioeconomic History   Marital status: Married    Spouse name: Not on file   Number of children: 2   Years of education: Not on file   Highest education level: Not on file  Occupational History   Occupation: [employer]  Tobacco Use   Smoking status: Every Day    Packs/day: 2.00    Years: 43.00    Total pack years: 86.00    Types: Cigarettes   Smokeless tobacco: Never   Tobacco comments:    stopped today 09-19-2019  Substance and Sexual Activity   Alcohol use: No    Alcohol/week: 0.0 standard drinks of alcohol   Drug use: No   Sexual activity: Not Currently    Birth control/protection: Surgical    Comment: hysterectomy  Other Topics Concern   Not on file  Social History Narrative   She is married, she has 1 son and one daughter   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 There is no height or weight on file to calculate BMI.  Generalized: Well developed, in no acute distress  Cardiology: normal rate and rhythm, no murmur noted Respiratory: clear to auscultation bilaterally  Neurological examination  Mentation: Alert oriented to time, place, history taking. Follows all commands speech and language fluent Cranial nerve II-XII: Pupils were equal round reactive to light. Extraocular movements were full, visual field were full  Motor: The motor testing reveals 5 over 5 strength of all 4 extremities. Good symmetric motor tone is noted throughout.  Gait and station: Gait is normal.    DIAGNOSTIC DATA (LABS, IMAGING, TESTING) - I reviewed patient records, labs, notes, testing and imaging myself where available.      No data to display           Lab Results  Component Value Date   WBC 8.6 01/24/2018   HGB 14.2 01/24/2018   HCT 43.0 01/24/2018   MCV 86.0 01/24/2018   PLT 300 01/24/2018      Component Value Date/Time   NA 140 01/24/2018 1610   K 3.7 01/24/2018 1610   CL 106 01/24/2018 1610   CO2 23 01/24/2018 1610   GLUCOSE 169 (H) 01/24/2018 1610   BUN 13 01/24/2018 1610   CREATININE 0.95 01/24/2018 1610   CALCIUM 9.5 01/24/2018 1610  GFRNONAA >60 01/24/2018 1610   GFRAA >60 01/24/2018 1610   No results found for: "CHOL", "HDL", "LDLCALC", "LDLDIRECT", "TRIG", "CHOLHDL" No results found for: "HGBA1C" No results found for: "VITAMINB12" No results found for: "TSH"   ASSESSMENT AND PLAN 66 y.o. year old female  has a past medical history of Abnormal Pap smear of cervix, Ankylosing spondylitis ([place]), Anxiety and depression, Arthritis, Asthma, Colon polyps, COPD (chronic obstructive pulmonary disease) ([place]), Diabetes mellitus without complication ([place]), Endometriosis, GERD (gastroesophageal reflux disease), colonic polyps (12/30/2014), Hyperlipidemia,  Internal hemorrhoids with prolapse - Gr 2 (12/17/2014), Migraines, Nicotine addiction, Osteopenia, Osteoporosis, Plantar fasciitis, Urinary and fecal incontinence (05/17/2018), and Vitamin D deficiency. here with   No diagnosis found.    OKSANA DEBERRY is doing well on CPAP therapy. Compliance report reveals ***. *** was encouraged to continue using CPAP nightly and for greater than 4 hours each night. We will update supply orders as indicated. Risks of untreated sleep apnea review and education materials provided. Healthy lifestyle habits encouraged. *** will follow up in ***, sooner if needed. *** verbalizes understanding and agreement with this plan.    No orders of the defined types were placed in this encounter.    No orders of the defined types were placed in this encounter.     Candie Chroman 05/11/2022, 8:38 AM [facility] [address], [place], [place] [address] [phone]

## 2022-05-11 NOTE — Patient Instructions (Incomplete)
Please continue using your CPAP regularly. While your insurance requires that you use CPAP at least 4 hours each night on 70% of the nights, I recommend, that you not skip any nights and use it throughout the night if you can. Getting used to CPAP and staying with the treatment long term does take time and patience and discipline. Untreated obstructive sleep apnea when it is moderate to severe can have an adverse impact on cardiovascular health and raise her risk for heart disease, arrhythmias, hypertension, congestive heart failure, stroke and diabetes. Untreated obstructive sleep apnea causes sleep disruption, nonrestorative sleep, and sleep deprivation. This can have an impact on your day to day functioning and cause daytime sleepiness and impairment of cognitive function, memory loss, mood disturbance, and problems focussing. Using CPAP regularly can improve these symptoms.  I will order a new sleep study. You will hear back from our office to schedule. We will order a new CPAP once study results are available.   Follow up in 31-90 days following set up of new machine.

## 2022-05-12 ENCOUNTER — Ambulatory Visit (HOSPITAL_COMMUNITY)
Admission: RE | Admit: 2022-05-12 | Discharge: 2022-05-12 | Disposition: A | Discharge status: Home / Self Care | Payer: Medicare Other | Source: Ambulatory Visit | Attending: Internal Medicine | Admitting: Internal Medicine

## 2022-05-12 ENCOUNTER — Encounter: Payer: Self-pay | Admitting: Family Medicine

## 2022-05-12 ENCOUNTER — Ambulatory Visit (INDEPENDENT_AMBULATORY_CARE_PROVIDER_SITE_OTHER): Payer: Medicare Other | Admitting: Family Medicine

## 2022-05-12 VITALS — BP 153/90 | HR 101 | Ht 62.0 in | Wt 151.0 lb

## 2022-05-12 DIAGNOSIS — Z9989 Dependence on other enabling machines and devices: Secondary | ICD-10-CM | POA: Diagnosis not present

## 2022-05-12 DIAGNOSIS — G4734 Idiopathic sleep related nonobstructive alveolar hypoventilation: Secondary | ICD-10-CM

## 2022-05-12 DIAGNOSIS — M810 Age-related osteoporosis without current pathological fracture: Secondary | ICD-10-CM | POA: Insufficient documentation

## 2022-05-12 DIAGNOSIS — G4733 Obstructive sleep apnea (adult) (pediatric): Secondary | ICD-10-CM

## 2022-05-12 DIAGNOSIS — J449 Chronic obstructive pulmonary disease, unspecified: Secondary | ICD-10-CM | POA: Diagnosis not present

## 2022-05-12 MED ORDER — DENOSUMAB 60 MG/ML ~~LOC~~ SOSY
PREFILLED_SYRINGE | SUBCUTANEOUS | Status: AC
  Filled 2022-05-12: qty 1

## 2022-05-12 MED ORDER — DENOSUMAB 60 MG/ML ~~LOC~~ SOSY
60.0000 mg | PREFILLED_SYRINGE | Freq: Once | SUBCUTANEOUS | Status: AC
  Administered 2022-05-12: 60 mg via SUBCUTANEOUS

## 2022-05-19 ENCOUNTER — Telehealth: Payer: Self-pay | Admitting: Family Medicine

## 2022-05-19 MED ORDER — TRAZODONE HCL 100 MG PO TABS: 100.0000 mg | ORAL_TABLET | Freq: Every day | ORAL | 0 refills | Status: DC

## 2022-05-19 NOTE — Telephone Encounter (Signed)
Pt is asking if she can get a revised Rx for her  traZODone (DESYREL) 50 MG tablet, to take 2 a night so she is able to sleep thru nights, please call

## 2022-05-19 NOTE — Telephone Encounter (Addendum)
Called pt back. She feels she needs higher dose of trazodone for sleep. Aware there is '100mg'$  tablet available. E-scribed this to pharmacy. AL,NP approved change.  She asked about update on HST. Aware it was ordered and pending insurance approval. Once sleep lab obtains, they will call her to get set up.   She also wanted phone# updated to have [phone].  I updated this.

## 2022-05-19 NOTE — Addendum Note (Signed)
Addended by: Wyvonnia Lora on: 05/19/2022 01:43 PM   Modules accepted: Orders

## 2022-06-01 ENCOUNTER — Other Ambulatory Visit: Payer: Self-pay | Admitting: Neurology

## 2022-06-06 ENCOUNTER — Ambulatory Visit (INDEPENDENT_AMBULATORY_CARE_PROVIDER_SITE_OTHER): Payer: Medicare Other

## 2022-06-06 DIAGNOSIS — F17210 Nicotine dependence, cigarettes, uncomplicated: Secondary | ICD-10-CM | POA: Diagnosis not present

## 2022-06-06 DIAGNOSIS — R911 Solitary pulmonary nodule: Secondary | ICD-10-CM

## 2022-06-07 ENCOUNTER — Telehealth: Payer: Self-pay | Admitting: Family Medicine

## 2022-06-07 NOTE — Telephone Encounter (Signed)
Pt called wanting to know the update on when she will be called to get scheduled for her HST. Please advise.

## 2022-06-07 NOTE — Telephone Encounter (Signed)
Raquel Sarna, can you f/u with pt? Order placed 05/12/22.

## 2022-06-08 ENCOUNTER — Other Ambulatory Visit: Payer: Self-pay | Admitting: Acute Care

## 2022-06-08 DIAGNOSIS — Z122 Encounter for screening for malignant neoplasm of respiratory organs: Secondary | ICD-10-CM

## 2022-06-08 DIAGNOSIS — Z87891 Personal history of nicotine dependence: Secondary | ICD-10-CM

## 2022-06-08 DIAGNOSIS — F17210 Nicotine dependence, cigarettes, uncomplicated: Secondary | ICD-10-CM

## 2022-06-15 NOTE — Telephone Encounter (Signed)
HST- Medicare/BCBS fed no auth req ref # [id number].  Patient is scheduled at [facility] for 07/12/22 at 11 AM.  Mailed packet to the patient.

## 2022-07-12 ENCOUNTER — Ambulatory Visit (INDEPENDENT_AMBULATORY_CARE_PROVIDER_SITE_OTHER): Payer: Medicare Other | Admitting: Neurology

## 2022-07-12 DIAGNOSIS — J439 Emphysema, unspecified: Secondary | ICD-10-CM

## 2022-07-12 DIAGNOSIS — G4733 Obstructive sleep apnea (adult) (pediatric): Secondary | ICD-10-CM | POA: Diagnosis not present

## 2022-07-14 NOTE — Progress Notes (Signed)
         [facility] TEST REPORT ( by Watch PAT)   STUDY DATE:  07-14-2022 DOB:   MRN:    ORDERING CLINICIAN: Debbora Presto, NP  REFERRING CLINICIAN: Larey Seat, MD    CLINICAL INFORMATION/HISTORY:  OSA -Needs new CPAP machine, is doing well on current treatment.       Epworth sleepiness score: 6/24. FSS at 44/ 63 points    BMI:  28 kg/m   Neck Circumference: 14.5 "   FINDINGS:   Sleep Summary:   Total Recording Time (hours, min):   9 h and 28 m      Total Sleep Time (hours, min):  8 h and 15 m               Percent REM (%):  25%                                      Respiratory Indices:   Calculated pAHI (per hour):  9.1/h                            REM pAHI:  5.8 /h                                               NREM pAHI:    10.2/h                           Supine AHI: 18.8/h                                                   Oxygen Saturation Statistics:   Oxygen Saturation (%) Mean:    92%          O2 Saturation Range (%):    85% through 96 %                                   O2 Saturation (minutes) <89%: 1.4           Pulse Rate Statistics:             Pulse Range:   66 through 100 bpm              IMPRESSION:  This HST confirms the presence of mild sleep apnea, NREM dominant and strongly supine position dependent.    RECOMMENDATION: autotitration device 6-12 cm water pressure, heated humidification, 2 cm EPR and mask of patients choice.     INTERPRETING PHYSICIAN:   Larey Seat, MD   Medical Director of [facility] at [facility].

## 2022-07-17 NOTE — Procedures (Signed)
       [facility] TEST REPORT ( by Watch PAT)   STUDY DATE:  07-14-2022 DOB:   MRN:    ORDERING CLINICIAN: Debbora Presto, NP  REFERRING CLINICIAN: Larey Seat, MD    CLINICAL INFORMATION/HISTORY:  OSA -Needs new CPAP machine, is doing well on current treatment.       Epworth sleepiness score: 6/24. FSS at 44/ 63 points    BMI:  28 kg/m   Neck Circumference: 14.5 "   FINDINGS:   Sleep Summary:   Total Recording Time (hours, min):   9 h and 28 m      Total Sleep Time (hours, min):  8 h and 15 m               Percent REM (%):  25%                                      Respiratory Indices:   Calculated pAHI (per hour):  9.1/h                            REM pAHI:  5.8 /h                                               NREM pAHI:    10.2/h                           Supine AHI: 18.8/h                                                   Oxygen Saturation Statistics:   Oxygen Saturation (%) Mean:    92%          O2 Saturation Range (%):    85% through 96 %                                   O2 Saturation (minutes) <89%: 1.4           Pulse Rate Statistics:             Pulse Range:   66 through 100 bpm              IMPRESSION:  This HST confirms the presence of mild sleep apnea, NREM dominant and strongly supine position dependent.    RECOMMENDATION: autotitration device 6-12 cm water pressure, heated humidification, 2 cm EPR and mask of patients choice.     INTERPRETING PHYSICIAN:   Larey Seat, MD   Medical Director of [facility] at [facility].

## 2022-07-19 ENCOUNTER — Telehealth: Payer: Self-pay | Admitting: *Deleted

## 2022-07-19 ENCOUNTER — Other Ambulatory Visit: Payer: Self-pay | Admitting: Family Medicine

## 2022-07-19 DIAGNOSIS — G4733 Obstructive sleep apnea (adult) (pediatric): Secondary | ICD-10-CM

## 2022-07-19 NOTE — Telephone Encounter (Signed)
I called pt. I advised pt that Amy Lomax,NP reviewed their sleep study results and found that pt has OSA and qualifies for new machine. I reviewed PAP compliance expectations with the pt. Pt is agreeable to starting a new CPAP. I advised pt that an order will be sent to a DME, [facility], and [facility] will call the pt within about one week after they file with the pt's insurance. [facility] will show the pt how to use the machine, fit for masks, and troubleshoot the CPAP if needed. A follow up appt was made for insurance purposes with Debbora Presto, NP  on 09/22/22 at 9am as mychart VV. Pt verbalized understanding of results. Pt had no questions at this time but was encouraged to call back if questions arise. I have sent the order to [place] and have received confirmation that they have received the order.  If [facility] cannot take her insurance she is ok to go back to [facility] but only wants to go to [facility], NOT [place] location.

## 2022-08-15 ENCOUNTER — Other Ambulatory Visit: Payer: Self-pay | Admitting: Registered Nurse

## 2022-08-15 DIAGNOSIS — R109 Unspecified abdominal pain: Secondary | ICD-10-CM

## 2022-08-15 DIAGNOSIS — R3129 Other microscopic hematuria: Secondary | ICD-10-CM

## 2022-08-30 ENCOUNTER — Other Ambulatory Visit: Payer: Self-pay

## 2022-08-30 MED ORDER — TRAZODONE HCL 50 MG PO TABS: ORAL_TABLET | ORAL | 0 refills | Status: DC

## 2022-09-15 ENCOUNTER — Other Ambulatory Visit: Payer: Federal, State, Local not specified - PPO

## 2022-09-22 ENCOUNTER — Telehealth: Payer: Medicare Other | Admitting: Family Medicine

## 2022-10-17 ENCOUNTER — Ambulatory Visit
Admission: RE | Admit: 2022-10-17 | Discharge: 2022-10-17 | Disposition: A | Discharge status: Home / Self Care | Payer: Federal, State, Local not specified - PPO | Source: Ambulatory Visit | Attending: Registered Nurse | Admitting: Registered Nurse

## 2022-10-17 DIAGNOSIS — R109 Unspecified abdominal pain: Secondary | ICD-10-CM

## 2022-10-17 DIAGNOSIS — R3129 Other microscopic hematuria: Secondary | ICD-10-CM

## 2022-10-31 NOTE — Patient Instructions (Incomplete)
Please continue using your CPAP regularly. While your insurance requires that you use CPAP at least 4 hours each night on 70% of the nights, I recommend, that you not skip any nights and use it throughout the night if you can. Getting used to CPAP and staying with the treatment long term does take time and patience and discipline. Untreated obstructive sleep apnea when it is moderate to severe can have an adverse impact on cardiovascular health and raise her risk for heart disease, arrhythmias, hypertension, congestive heart failure, stroke and diabetes. Untreated obstructive sleep apnea causes sleep disruption, nonrestorative sleep, and sleep deprivation. This can have an impact on your day to day functioning and cause daytime sleepiness and impairment of cognitive function, memory loss, mood disturbance, and problems focussing. Using CPAP regularly can improve these symptoms.  Continue using Biotene if it helps. Adjust humidity at home. We will ask [facility] to look at your mask. Please give them a call. We will discuss trazodone dose with Dr Brett Fairy.   [facility]  [phone]  Follow up in 6 months

## 2022-10-31 NOTE — Progress Notes (Unsigned)
PATIENT: Candace Medina DOB: [birth year]  REASON FOR VISIT: follow up HISTORY FROM: patient  No chief complaint on file.    HISTORY OF PRESENT ILLNESS:  10/31/22 ALL:  [name] returns for follow up for OSA on CPAP.   05/12/2022 ALL: [name] is a 67 y.o. female here today for follow up for OSA on CPAP.  She is doing very well on therapy. She reports that she wakes feeling refreshed. CPAP has improved sleep quality as well. She originally received a traditional CPAP machine but then opted to trade it for a travel machine. She returned the original machine but now wishes to get another.   Data reviewed day by day on Air mini app. Apneic events range from 0.9-3.4/hr. Usage looks good as a whole. No significant leak noted.   HISTORY: (copied from Dr Lurline Del previous note)  CAMARON Medina is a 67 y.o.  Caucasian female patient seen here in a RV on 06/24/2021 : The patient was invited for a sleep study in April 2022 took place on 4-19 2022 and confirmed the patient had obstructive sleep apnea with prolonged hypoxemia most of her apneic events were actually hypopneas so qualified as shallow breathing rather than frank apnea.  The patient qualified as mild sleep apnea at 12.4 AHI overall there was no REM sleep dependency, in supine sleep her AHI was 26.2 so avoiding sleeping on the back could be very helpful.  Her oxygen nadir was 84% but the time spent in hypoxia was 84 minutes.  There was significant contribution during REM sleep noted.  We asked her to return for an attended CPAP titration.  This took place on 6-29 2022 also we wanted to have the opportunity to give oxygen if needed.  CPAP was initiated on the ResMed nasal mask and 20 and medium sized and was titrated from 5-8 cmH2O but the AHI became 0.0.  REM sleep rebounded to almost 40% of the night.  She could not recall that she was dreaming anything for a long time.  The AHI was significantly reduced and we ordered an auto CPAP  with a setting between 6 and 10 cmH2O no EPR heated humidification and the N 20 ResMed mask.  She had responded very well to 8 cm water pressure.  The patient then was actually finished with an auto titration device which she later exchanged for a travel CPAP.  The auto titration however she had used through the months of August through October and it showed 100% compliant user time with an average of 7 hours and 1 minute.  Her AutoSet was actually given with a 3 cm EPR also I had ordered no EPR.  The residual AHI was 1.9 the 95th percentile pressure was 9.8 cmH2O and her residual apneas were vastly obstructive and not central in origin.   There were some higher air leaks with a nasal mask the 95th percentile air leak was qualified at 36.5 L/min.  She has been given a full facemask by choice but stated that the facial mask feels too small.  She was yesterday refitted to a medium size mask.    We need to set the travel machine now: and she used 10 cm water pressure w for most of the night- I will set it to 10 cm water but I am not sure she can get an EPR setting. If no EPR setting is possible on travel CPAP, will set to 8 cm water.    Her Migraines  have improved her hypersomnia is still there, but is better.    REVIEW OF SYSTEMS: Out of a complete 14 system review of symptoms, the patient complains only of the following symptoms, arthritis, ringing in ears, hearing loss and all other reviewed systems are negative.  ESS: 6/24 FSS: 44/63  ALLERGIES: Allergies  Allergen Reactions   Other Rash and Other (See Comments)    mosquitoes   Venomil Honey Bee Venom [Honey Bee Venom] Anaphylaxis    ALL VENOMOUS INSECTS    Boniva [Ibandronic Acid]     Couldn't walk   Keflex [Cephalexin] Itching   Neosporin [Neomycin-Bacitracin Zn-Polymyx]    Tramadol     Only can take 1 tab a day   Codeine Rash   Morphine And Related Swelling and Rash    HOME MEDICATIONS: Outpatient Medications Prior to Visit   Medication Sig Dispense Refill   albuterol (PROVENTIL HFA;VENTOLIN HFA) 108 (90 BASE) MCG/ACT inhaler Inhale into the lungs every 6 (six) hours as needed for wheezing or shortness of breath.     ALPRAZolam (XANAX) 0.5 MG tablet Take 0.5 mg by mouth at bedtime as needed for anxiety or sleep.      budesonide-formoterol (SYMBICORT) 80-4.5 MCG/ACT inhaler Inhale 2 puffs into the lungs 2 (two) times daily.     desvenlafaxine (PRISTIQ) 50 MG 24 hr tablet Take 50 mg by mouth daily.     EPINEPHrine 0.3 mg/0.3 mL IJ SOAJ injection Inject 0.3 mg into the muscle as needed for anaphylaxis.  1   ergocalciferol (VITAMIN D2) 50000 UNITS capsule Take 50,000 Units by mouth 2 (two) times a week.      fluticasone (FLONASE) 50 MCG/ACT nasal spray Place 1 spray into the nose daily as needed for allergies or rhinitis.      metFORMIN (GLUCOPHAGE) 1000 MG tablet Take 1,000 mg by mouth 2 (two) times daily with a meal.     MYRBETRIQ 25 MG TB24 tablet Take 25 mg by mouth daily.     OZEMPIC, 1 MG/DOSE, 4 MG/3ML SOPN INJECT 1 MG UNDER THE SKIN ONCE A WEEK     pantoprazole (PROTONIX) 40 MG tablet Take 40 mg by mouth daily.     PROLIA 60 MG/ML SOSY injection      propranolol ER (INDERAL LA) 60 MG 24 hr capsule Take 60 mg by mouth daily.     RESTASIS 0.05 % ophthalmic emulsion 1 drop 2 (two) times daily.     rosuvastatin (CRESTOR) 10 MG tablet Take 10 mg by mouth daily.     Secukinumab (COSENTYX SENSOREADY PEN) 150 MG/ML SOAJ Cosentyx Pen 150 mg/mL subcutaneous     traZODone (DESYREL) 100 MG tablet Take 1 tablet (100 mg total) by mouth at bedtime. 90 tablet 0   traZODone (DESYREL) 50 MG tablet TAKE 1 TABLET(50 MG) BY MOUTH AT BEDTIME 90 tablet 0   No facility-administered medications prior to visit.    PAST MEDICAL HISTORY: Past Medical History:  Diagnosis Date   Abnormal Pap smear of cervix    Ankylosing spondylitis ([place])    Anxiety and depression    Arthritis    Asthma    Colon polyps    COPD (chronic  obstructive pulmonary disease) ([place])    Diabetes mellitus without complication ([place])    Endometriosis    GERD (gastroesophageal reflux disease)    Hx of colonic polyps 12/30/2014   Hyperlipidemia    Internal hemorrhoids with prolapse - Gr 2 12/17/2014   Migraines    Nicotine addiction  Osteopenia    Osteoporosis    Plantar fasciitis    Urinary and fecal incontinence 05/17/2018   Vitamin D deficiency     PAST SURGICAL HISTORY: Past Surgical History:  Procedure Laterality Date   COLONOSCOPY     CRYOTHERAPY     for abnormal pap smear   HEMORRHOID BANDING  02/2015   LAPAROSCOPIC CHOLECYSTECTOMY  09/2017   Dr. Georgette Dover   SHOULDER ARTHROSCOPY Right    bursitis   SHOULDER SURGERY Left    TONSILLECTOMY     TOTAL ABDOMINAL HYSTERECTOMY  1986   UPPER GASTROINTESTINAL ENDOSCOPY      FAMILY HISTORY: Family History  Problem Relation Age of Onset   Bladder Cancer Father    Esophageal cancer Mother    Melanoma Brother    Heart disease Other        mother's entire family-13 Bro and Sis   Brain cancer Paternal Aunt    Lung cancer Paternal Uncle    Bone cancer Paternal Uncle    Lung cancer Paternal Aunt    Stroke Maternal Grandmother    Diabetes Paternal Grandmother    Leukemia Paternal Aunt     SOCIAL HISTORY: Social History   Socioeconomic History   Marital status: Married    Spouse name: Not on file   Number of children: 2   Years of education: Not on file   Highest education level: Not on file  Occupational History   Occupation: [employer]  Tobacco Use   Smoking status: Every Day    Packs/day: 2.00    Years: 43.00    Total pack years: 86.00    Types: Cigarettes   Smokeless tobacco: Never   Tobacco comments:    stopped today 09-19-2019  Substance and Sexual Activity   Alcohol use: No    Alcohol/week: 0.0 standard drinks of alcohol   Drug use: No   Sexual activity: Not Currently    Birth control/protection: Surgical    Comment: hysterectomy  Other Topics Concern   Not  on file  Social History Narrative   She is married, she has 1 son and one daughter   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  There is no height or weight on file to calculate BMI.  Neck cir: 14.5"  Generalized: Well developed, in no acute distress  Cardiology: normal rate and rhythm, no murmur noted Respiratory: clear to auscultation bilaterally  Neurological examination  Mentation: Alert oriented to time, place, history taking. Follows all commands speech and language fluent Cranial nerve II-XII: Pupils were equal round reactive to light. Extraocular movements were full, visual field were full  Motor: The motor testing reveals 5 over 5 strength of all 4 extremities. Good symmetric motor tone is noted throughout.  Gait and station: Gait is normal.    DIAGNOSTIC DATA (LABS, IMAGING, TESTING) - I reviewed patient records, labs, notes, testing and imaging myself where available.      No  data to display           Lab Results  Component Value Date   WBC 8.6 01/24/2018   HGB 14.2 01/24/2018   HCT 43.0 01/24/2018   MCV 86.0 01/24/2018   PLT 300 01/24/2018      Component Value Date/Time   NA 140 01/24/2018 1610   K 3.7 01/24/2018 1610   CL 106 01/24/2018 1610   CO2 23 01/24/2018 1610   GLUCOSE 169 (H) 01/24/2018 1610   BUN 13 01/24/2018 1610   CREATININE 0.95 01/24/2018 1610   CALCIUM 9.5 01/24/2018 1610   GFRNONAA >60 01/24/2018 1610   GFRAA >60 01/24/2018 1610   No  results found for: "CHOL", "HDL", "LDLCALC", "LDLDIRECT", "TRIG", "CHOLHDL" No results found for: "HGBA1C" No results found for: "VITAMINB12" No results found for: "TSH"   ASSESSMENT AND PLAN 67 y.o. year old female  has a past medical history of Abnormal Pap smear of cervix, Ankylosing spondylitis ([place]), Anxiety and depression, Arthritis, Asthma, Colon polyps, COPD (chronic obstructive pulmonary disease) ([place]), Diabetes mellitus without complication ([place]), Endometriosis, GERD (gastroesophageal reflux disease), colonic polyps (12/30/2014), Hyperlipidemia, Internal hemorrhoids with prolapse - Gr 2 (12/17/2014), Migraines, Nicotine addiction, Osteopenia, Osteoporosis, Plantar fasciitis, Urinary and fecal incontinence (05/17/2018), and Vitamin D deficiency. here with   No diagnosis found.     EDITH LARGO is doing well on CPAP therapy. Compliance report reveals excellent compliance. She was encouraged to continue using CPAP nightly and for greater than 4 hours each night. I will repeat HST then anticipate ordering new CPAP machine. We will update supply orders as indicated. Risks of untreated sleep apnea review and education materials provided. Healthy lifestyle habits encouraged. She will follow up in 31-90 days following set up date for new machine. She verbalizes understanding and agreement with this plan.    No orders of the defined types were placed in this encounter.    No orders of the defined types were placed in this encounter.     Debbora Presto, FNP-C 10/31/2022, 10:34 AM [facility] [address] [place], [place] [address] [phone]

## 2022-11-01 ENCOUNTER — Ambulatory Visit (INDEPENDENT_AMBULATORY_CARE_PROVIDER_SITE_OTHER): Payer: Medicare Other | Admitting: Family Medicine

## 2022-11-01 ENCOUNTER — Telehealth: Payer: Self-pay | Admitting: Family Medicine

## 2022-11-01 ENCOUNTER — Encounter: Payer: Self-pay | Admitting: Family Medicine

## 2022-11-01 VITALS — BP 129/71 | HR 92 | Ht 62.0 in | Wt 146.0 lb

## 2022-11-01 DIAGNOSIS — G4733 Obstructive sleep apnea (adult) (pediatric): Secondary | ICD-10-CM | POA: Diagnosis not present

## 2022-11-01 MED ORDER — TRAZODONE HCL 100 MG PO TABS: 100.0000 mg | ORAL_TABLET | Freq: Every day | ORAL | 1 refills | Status: DC

## 2022-11-01 NOTE — Telephone Encounter (Signed)
Sent mychart msg informing pt of appointment cancellation

## 2022-11-03 ENCOUNTER — Telehealth: Payer: Federal, State, Local not specified - PPO | Admitting: Family Medicine

## 2023-05-08 NOTE — Patient Instructions (Incomplete)
Please continue using your CPAP regularly. While your insurance requires that you use CPAP at least 4 hours each night on 70% of the nights, I recommend, that you not skip any nights and use it throughout the night if you can. Getting used to CPAP and staying with the treatment long term does take time and patience and discipline. Untreated obstructive sleep apnea when it is moderate to severe can have an adverse impact on cardiovascular health and raise her risk for heart disease, arrhythmias, hypertension, congestive heart failure, stroke and diabetes. Untreated obstructive sleep apnea causes sleep disruption, nonrestorative sleep, and sleep deprivation. This can have an impact on your day to day functioning and cause daytime sleepiness and impairment of cognitive function, memory loss, mood disturbance, and problems focussing. Using CPAP regularly can improve these symptoms.  We will update supply orders, today. Talk to [facility] about the leak in your full face mask. See if there is a better fit.   DME CPAP: [facility] Phone:[phone]  Continue trazodone 100mg  daily.   Follow up in 1 year

## 2023-05-08 NOTE — Progress Notes (Unsigned)
PATIENT: Candace Medina DOB: 1956/04/04  REASON FOR VISIT: follow up HISTORY FROM: patient  No chief complaint on file.    HISTORY OF PRESENT ILLNESS:  05/08/23 ALL:  Candace Medina returns for follow up for OSA on CPAP. She was last seen 10/2022 and having difficulty maintaining sleep. We increased trazodone to 100mg  at bedtime and encouraged regular use of CPAP. Since,     11/01/2022 ALL: Candace Medina returns for follow up for OSA on CPAP. She received a new CPAP machine. She feels that she is doing ok but having more dry mouth. She is not sure how to adjust humidity. She reports her FFM is not comfortable. It is the same mask that she uses with her travel machine but feels something is different. She has had more air leaking. She continues trazodone at bedtime. She has been taking 50mg  most nights but requested increased dose to 100mg . She has tolerated increased dose well. She is also taking Pristiq 50mg  daily. Mood is stable. She does not take alprazolam regularly.     05/12/2022 ALL: Candace Medina is a 67 y.o. female here today for follow up for OSA on CPAP.  She is doing very well on therapy. She reports that she wakes feeling refreshed. CPAP has improved sleep quality as well. She originally received a traditional CPAP machine but then opted to trade it for a travel machine. She returned the original machine but now wishes to get another.   Data reviewed day by day on Air mini app. Apneic events range from 0.9-3.4/hr. Usage looks good as a whole. No significant leak noted.   HISTORY: (copied from Dr Holland Commons previous note)  Candace Medina is a 67 y.o.  Caucasian female patient seen here in a RV on 06/24/2021 : The patient was invited for a sleep study in April 2022 took place on 4-19 2022 and confirmed the patient had obstructive sleep apnea with prolonged hypoxemia most of her apneic events were actually hypopneas so qualified as shallow breathing rather than frank apnea.  The patient  qualified as mild sleep apnea at 12.4 AHI overall there was no REM sleep dependency, in supine sleep her AHI was 26.2 so avoiding sleeping on the back could be very helpful.  Her oxygen nadir was 84% but the time spent in hypoxia was 84 minutes.  There was significant contribution during REM sleep noted.  We asked her to return for an attended CPAP titration.  This took place on 6-29 2022 also we wanted to have the opportunity to give oxygen if needed.  CPAP was initiated on the ResMed nasal mask and 20 and medium sized and was titrated from 5-8 cmH2O but the AHI became 0.0.  REM sleep rebounded to almost 40% of the night.  She could not recall that she was dreaming anything for a long time.  The AHI was significantly reduced and we ordered an auto CPAP with a setting between 6 and 10 cmH2O no EPR heated humidification and the N 20 ResMed mask.  She had responded very well to 8 cm water pressure.  The patient then was actually finished with an auto titration device which she later exchanged for a travel CPAP.  The auto titration however she had used through the months of August through October and it showed 100% compliant user time with an average of 7 hours and 1 minute.  Her AutoSet was actually given with a 3 cm EPR also I had ordered no EPR.  The  residual AHI was 1.9 the 95th percentile pressure was 9.8 cmH2O and her residual apneas were vastly obstructive and not central in origin.   There were some higher air leaks with a nasal mask the 95th percentile air leak was qualified at 36.5 L/min.  She has been given a full facemask by choice but stated that the facial mask feels too small.  She was yesterday refitted to a medium size mask.    We need to set the travel machine now: and she used 10 cm water pressure w for most of the night- I will set it to 10 cm water but I am not sure she can get an EPR setting. If no EPR setting is possible on travel CPAP, will set to 8 cm water.    Her Migraines have improved  her hypersomnia is still there, but is better.    REVIEW OF SYSTEMS: Out of a complete 14 system review of symptoms, the patient complains only of the following symptoms, arthritis, ringing in ears, hearing loss and all other reviewed systems are negative.  ESS: 14/24, previously 6/24 FSS: 44/63  ALLERGIES: Allergies  Allergen Reactions   Other Rash and Other (See Comments)    mosquitoes   Venomil Honey Bee Venom [Honey Bee Venom] Anaphylaxis    ALL VENOMOUS INSECTS    Boniva [Ibandronic Acid]     Couldn't walk   Keflex [Cephalexin] Itching   Neosporin [Neomycin-Bacitracin Zn-Polymyx]    Tramadol     Only can take 1 tab a day   Codeine Rash   Morphine And Codeine Swelling and Rash    HOME MEDICATIONS: Outpatient Medications Prior to Visit  Medication Sig Dispense Refill   albuterol (PROVENTIL HFA;VENTOLIN HFA) 108 (90 BASE) MCG/ACT inhaler Inhale into the lungs every 6 (six) hours as needed for wheezing or shortness of breath.     ALPRAZolam (XANAX) 0.5 MG tablet Take 0.5 mg by mouth at bedtime as needed for anxiety or sleep.      budesonide-formoterol (SYMBICORT) 80-4.5 MCG/ACT inhaler Inhale 2 puffs into the lungs 2 (two) times daily.     desvenlafaxine (PRISTIQ) 50 MG 24 hr tablet Take 50 mg by mouth daily.     EPINEPHrine 0.3 mg/0.3 mL IJ SOAJ injection Inject 0.3 mg into the muscle as needed for anaphylaxis.  1   ergocalciferol (VITAMIN D2) 50000 UNITS capsule Take 50,000 Units by mouth 2 (two) times a week.      fluticasone (FLONASE) 50 MCG/ACT nasal spray Place 1 spray into the nose daily as needed for allergies or rhinitis.      metFORMIN (GLUCOPHAGE) 1000 MG tablet Take 1,000 mg by mouth 2 (two) times daily with a meal.     MYRBETRIQ 25 MG TB24 tablet Take 25 mg by mouth daily.     OZEMPIC, 1 MG/DOSE, 4 MG/3ML SOPN INJECT 1 MG UNDER THE SKIN ONCE A WEEK     pantoprazole (PROTONIX) 40 MG tablet Take 40 mg by mouth daily.     PROLIA 60 MG/ML SOSY injection       propranolol ER (INDERAL LA) 60 MG 24 hr capsule Take 60 mg by mouth daily.     RESTASIS 0.05 % ophthalmic emulsion 1 drop 2 (two) times daily.     rosuvastatin (CRESTOR) 10 MG tablet Take 10 mg by mouth daily.     traZODone (DESYREL) 100 MG tablet Take 1 tablet (100 mg total) by mouth at bedtime. 90 tablet 1   No facility-administered medications prior to  visit.    PAST MEDICAL HISTORY: Past Medical History:  Diagnosis Date   Abnormal Pap smear of cervix    Ankylosing spondylitis (HCC)    Anxiety and depression    Arthritis    Asthma    Colon polyps    COPD (chronic obstructive pulmonary disease) (HCC)    Diabetes mellitus without complication (HCC)    Endometriosis    GERD (gastroesophageal reflux disease)    Hx of colonic polyps 12/30/2014   Hyperlipidemia    Internal hemorrhoids with prolapse - Gr 2 12/17/2014   Migraines    Nicotine addiction    Osteopenia    Osteoporosis    Plantar fasciitis    Urinary and fecal incontinence 05/17/2018   Vitamin D deficiency     PAST SURGICAL HISTORY: Past Surgical History:  Procedure Laterality Date   COLONOSCOPY     CRYOTHERAPY     for abnormal pap smear   HEMORRHOID BANDING  02/2015   LAPAROSCOPIC CHOLECYSTECTOMY  09/2017   Dr. Corliss Skains   SHOULDER ARTHROSCOPY Right    bursitis   SHOULDER SURGERY Left    TONSILLECTOMY     TOTAL ABDOMINAL HYSTERECTOMY  1986   UPPER GASTROINTESTINAL ENDOSCOPY      FAMILY HISTORY: Family History  Problem Relation Age of Onset   Bladder Cancer Father    Esophageal cancer Mother    Melanoma Brother    Heart disease Other        mother's entire family-13 Bro and Sis   Brain cancer Paternal Aunt    Lung cancer Paternal Uncle    Bone cancer Paternal Uncle    Lung cancer Paternal Aunt    Stroke Maternal Grandmother    Diabetes Paternal Grandmother    Leukemia Paternal Aunt     SOCIAL HISTORY: Social History   Socioeconomic History   Marital status: Married    Spouse name: Not on file    Number of children: 2   Years of education: Not on file   Highest education level: Not on file  Occupational History   Occupation: [employer]  Tobacco Use   Smoking status: Every Day    Current packs/day: 2.00    Average packs/day: 2.0 packs/day for 43.0 years (86.0 ttl pk-yrs)    Types: Cigarettes   Smokeless tobacco: Never   Tobacco comments:    stopped today 09-19-2019  Substance and Sexual Activity   Alcohol use: No    Alcohol/week: 0.0 standard drinks of alcohol   Drug use: No   Sexual activity: Not Currently    Birth control/protection: Surgical    Comment: hysterectomy  Other Topics Concern   Not on file  Social History Narrative   She is married, she has 1 son and one daughter   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[place] [employer]   Drinks 3 caffeinated beverages daily   She enjoys painting as a hobby   3 grandchildren, she has custody of 1 granddaughter   Some college education   Social Determinants of Corporate investment banker Strain: Not on file  Food Insecurity: Not on file  Transportation Needs: Not on file  Physical Activity: Not on file  Stress: No Stress Concern Present (02/25/2022)   Received from [facility], [facility]   [employer] [facility] - Occupational Stress Questionnaire    Feeling of Stress : Not at all  Social Connections: Unknown (02/17/2022)   Received from [facility], [facility]   Social Network  Social Network: Not on file  Intimate Partner Violence: Unknown (02/17/2022)   [facility] from [facility], [facility]   HITS    Physically Hurt: Not on file    Insult or Talk Down To: Not on file    Threaten Physical Harm: Not on file    Scream or Curse: Not on file     PHYSICAL EXAM  There were no vitals filed for this visit.   There is no height or weight on file to calculate BMI.  Neck cir: 14.5"  Generalized: Well developed, in no acute distress  Cardiology: normal rate and rhythm, no murmur  noted Respiratory: clear to auscultation bilaterally  Neurological examination  Mentation: Alert oriented to time, place, history taking. Follows all commands speech and language fluent Cranial nerve II-XII: Pupils were equal round reactive to light. Extraocular movements were full, visual field were full  Motor: The motor testing reveals 5 over 5 strength of all 4 extremities. Good symmetric motor tone is noted throughout.  Gait and station: Gait is normal.    DIAGNOSTIC DATA (LABS, IMAGING, TESTING) - I reviewed patient records, labs, notes, testing and imaging myself where available.      No data to display           Lab Results  Component Value Date   WBC 8.6 01/24/2018   HGB 14.2 01/24/2018   HCT 43.0 01/24/2018   MCV 86.0 01/24/2018   PLT 300 01/24/2018      Component Value Date/Time   NA 140 01/24/2018 1610   K 3.7 01/24/2018 1610   CL 106 01/24/2018 1610   CO2 23 01/24/2018 1610   GLUCOSE 169 (H) 01/24/2018 1610   BUN 13 01/24/2018 1610   CREATININE 0.95 01/24/2018 1610   CALCIUM 9.5 01/24/2018 1610   GFRNONAA >60 01/24/2018 1610   GFRAA >60 01/24/2018 1610   No results found for: "CHOL", "HDL", "LDLCALC", "LDLDIRECT", "TRIG", "CHOLHDL" No results found for: "HGBA1C" No results found for: "VITAMINB12" No results found for: "TSH"   ASSESSMENT AND PLAN 67 y.o. year old female  has a past medical history of Abnormal Pap smear of cervix, Ankylosing spondylitis (HCC), Anxiety and depression, Arthritis, Asthma, Colon polyps, COPD (chronic obstructive pulmonary disease) (HCC), Diabetes mellitus without complication (HCC), Endometriosis, GERD (gastroesophageal reflux disease), colonic polyps (12/30/2014), Hyperlipidemia, Internal hemorrhoids with prolapse - Gr 2 (12/17/2014), Migraines, Nicotine addiction, Osteopenia, Osteoporosis, Plantar fasciitis, Urinary and fecal incontinence (05/17/2018), and Vitamin D deficiency. here with   No diagnosis found.     Candace Medina is doing well on CPAP therapy. Compliance report reveals acceptable daily use but sub optimal 4 hour compliance, however, she used her travel machine while on vacation for 2 weeks. We discussed concerns of mask fit. I will send her for a mask refitting. She may continue Biotene products and will work with DME to adjust humidity at home. We will update supply orders as indicated. Risks of untreated sleep apnea review and education materials provided. After discussion with Dr Vickey Huger, we will increase trazodone dose to 100mg  QHS. Discussed possible side effects including serotonin syndrome. She will monitor closely. She will discuss weaning Pristiq with PCP. Healthy lifestyle habits encouraged. She will follow up in 6 months. She verbalizes understanding and agreement with this plan.    No orders of the defined types were placed in this encounter.    No orders of the defined types were placed in this encounter.     Shawnie Dapper, FNP-C 05/08/2023, 5:19  PM [facility] [address], [address] [place], [place] [address] [phone]

## 2023-05-09 ENCOUNTER — Encounter: Payer: Self-pay | Admitting: Family Medicine

## 2023-05-09 ENCOUNTER — Ambulatory Visit (INDEPENDENT_AMBULATORY_CARE_PROVIDER_SITE_OTHER): Payer: Medicare Other | Admitting: Family Medicine

## 2023-05-09 VITALS — BP 167/103 | HR 87 | Ht 62.0 in | Wt 143.5 lb

## 2023-05-09 DIAGNOSIS — G4701 Insomnia due to medical condition: Secondary | ICD-10-CM

## 2023-05-09 DIAGNOSIS — G2581 Restless legs syndrome: Secondary | ICD-10-CM

## 2023-05-09 DIAGNOSIS — G4733 Obstructive sleep apnea (adult) (pediatric): Secondary | ICD-10-CM

## 2023-05-09 MED ORDER — TRAZODONE HCL 100 MG PO TABS: 100.0000 mg | ORAL_TABLET | Freq: Every day | ORAL | 3 refills | Status: DC

## 2023-05-11 ENCOUNTER — Ambulatory Visit: Payer: Federal, State, Local not specified - PPO | Admitting: Family Medicine

## 2023-06-08 ENCOUNTER — Ambulatory Visit (INDEPENDENT_AMBULATORY_CARE_PROVIDER_SITE_OTHER): Payer: Medicare Other

## 2023-06-08 DIAGNOSIS — F17210 Nicotine dependence, cigarettes, uncomplicated: Secondary | ICD-10-CM | POA: Diagnosis not present

## 2023-06-08 DIAGNOSIS — Z87891 Personal history of nicotine dependence: Secondary | ICD-10-CM

## 2023-06-08 DIAGNOSIS — Z122 Encounter for screening for malignant neoplasm of respiratory organs: Secondary | ICD-10-CM

## 2023-06-23 ENCOUNTER — Telehealth: Payer: Self-pay | Admitting: Acute Care

## 2023-06-23 NOTE — Telephone Encounter (Signed)
Called and left VM for patient to review results. Impression is below.      IMPRESSION: 1. Lung-RADS 3S, probably benign findings. Short-term follow-up in 6 months is recommended with repeat low-dose chest CT without contrast (please use the following order, "CT CHEST LCS NODULE FOLLOW-UP W/O CM"). 2. The "S" modifier above refers to potentially clinically significant non lung cancer related findings. Specifically, there is aortic atherosclerosis, in addition to two-vessel coronary artery disease. Please note that although the presence of coronary artery calcium documents the presence of coronary artery disease, the severity of this disease and any potential stenosis cannot be assessed on this non-gated CT examination. Assessment for potential risk factor modification, dietary therapy or pharmacologic therapy may be warranted, if clinically indicated. 3. Mild diffuse bronchial wall thickening with mild to moderate centrilobular and paraseptal emphysema; imaging findings suggestive of underlying COPD.   Aortic Atherosclerosis (ICD10-I70.0) and Emphysema (ICD10-J43.9).     Electronically Signed   By: Trudie Reed M.D.   On: 06/22/2023 15:23

## 2023-06-27 ENCOUNTER — Other Ambulatory Visit: Payer: Self-pay

## 2023-06-27 DIAGNOSIS — F17210 Nicotine dependence, cigarettes, uncomplicated: Secondary | ICD-10-CM

## 2023-06-27 DIAGNOSIS — R911 Solitary pulmonary nodule: Secondary | ICD-10-CM

## 2023-06-27 NOTE — Telephone Encounter (Signed)
Results reviewed with patient by phone.  New lung nodule with recommendation for follow up imaging in 6 months, as precaution.  Patient has had some increased clear congestion in lungs (mild) and attributes it to seasonal allergies.  No symptoms of fever, fatigue or signs of infection.  Atherosclerosis and emphysema, as previously noted. Patient is on statin medication and will discuss cardiac findings with PCP at next visit.  Patient acknowledged understanding of review of results and agrees with repeat LDCT in 6 months.  Results/plan faxed to PCP and order placed for LDCT follow up 6 months.

## 2023-07-26 ENCOUNTER — Encounter: Payer: Self-pay | Admitting: Internal Medicine

## 2023-12-06 ENCOUNTER — Ambulatory Visit (INDEPENDENT_AMBULATORY_CARE_PROVIDER_SITE_OTHER): Payer: Medicare Other

## 2023-12-06 DIAGNOSIS — R911 Solitary pulmonary nodule: Secondary | ICD-10-CM

## 2023-12-06 DIAGNOSIS — F17210 Nicotine dependence, cigarettes, uncomplicated: Secondary | ICD-10-CM

## 2024-01-08 ENCOUNTER — Other Ambulatory Visit: Payer: Self-pay | Admitting: Acute Care

## 2024-01-08 DIAGNOSIS — F17210 Nicotine dependence, cigarettes, uncomplicated: Secondary | ICD-10-CM

## 2024-01-08 DIAGNOSIS — Z122 Encounter for screening for malignant neoplasm of respiratory organs: Secondary | ICD-10-CM

## 2024-01-08 DIAGNOSIS — Z87891 Personal history of nicotine dependence: Secondary | ICD-10-CM

## 2024-03-26 ENCOUNTER — Ambulatory Visit: Admitting: Podiatry

## 2024-04-02 ENCOUNTER — Encounter: Payer: Self-pay | Admitting: Podiatry

## 2024-04-02 ENCOUNTER — Ambulatory Visit (INDEPENDENT_AMBULATORY_CARE_PROVIDER_SITE_OTHER)

## 2024-04-02 ENCOUNTER — Ambulatory Visit (INDEPENDENT_AMBULATORY_CARE_PROVIDER_SITE_OTHER): Admitting: Podiatry

## 2024-04-02 DIAGNOSIS — M7752 Other enthesopathy of left foot: Secondary | ICD-10-CM | POA: Diagnosis not present

## 2024-04-02 DIAGNOSIS — M25571 Pain in right ankle and joints of right foot: Secondary | ICD-10-CM

## 2024-04-02 DIAGNOSIS — M25572 Pain in left ankle and joints of left foot: Secondary | ICD-10-CM | POA: Diagnosis not present

## 2024-04-02 DIAGNOSIS — L600 Ingrowing nail: Secondary | ICD-10-CM | POA: Diagnosis not present

## 2024-04-02 DIAGNOSIS — G8929 Other chronic pain: Secondary | ICD-10-CM | POA: Diagnosis not present

## 2024-04-02 DIAGNOSIS — M7751 Other enthesopathy of right foot: Secondary | ICD-10-CM | POA: Diagnosis not present

## 2024-04-02 NOTE — Progress Notes (Signed)
  Subjective:  Patient ID: Candace Medina, female    DOB: Feb 23, 1956,  MRN: [id number]  Chief Complaint  Patient presents with   ankle paiin    Rm13 Bilateral ankle pain for several years/ hurts with walking and prolong standing.    Discussed the use of AI scribe software for clinical note transcription with the patient, who gave verbal consent to proceed.  History of Present Illness Candace Medina is a 68 year old female with ankylosing spondylitis who presents with bilateral ankle pain.  She experiences severe bilateral ankle pain without recent injury. Swelling is sometimes present, but there is no redness or heat. Her ankylosing spondylitis is treated with Cosentyx infusions and sulfasalazine. She previously used Remicade without success. She has lost 80 pounds recently, which she believes affects her condition.  Her footwear has changed from comfortable to aggressive styles, contributing to her ankle pain.  She has a thick, ingrown toenail that bleeds when cut. Her father had similar toenail issues.      Objective:    Physical Exam General: AAO x3, NAD  Dermatological: Ingrown toenail present without any drainage or pus or edema, erythema or signs of infection.  She does get tenderness.  No open lesions.  Vascular: Dorsalis Pedis artery and Posterior Tibial artery pedal pulses are 2/4 bilateral with immedate capillary fill time.  There is no pain with calf compression, swelling, warmth, erythema.   Neruologic: Grossly intact via light touch bilateral.   Musculoskeletal: She is having quite a bit of pain to her ankles unable to appreciate any significant pain on palpation however most of it is with long standing.  There is no edema, erythema.  Ankle, subtalar range of motion intact but any restrictions.  MMT 5/5.  There does appear to be a slightly discrepancy as well.  Gait: Unassisted, Nonantalgic.     No images are attached to the encounter.     Results RADIOLOGY Ankle X-ray: Joint appears normal with no significant arthritic changes. Minor calcaneal changes not causing issues.  No evidence of acute fracture.   Assessment:   1. Chronic pain of left ankle   2. Chronic pain of right ankle   3.      Ingrown toenail   Plan:  Patient was evaluated and treated and all questions answered.  Assessment and Plan Assessment & Plan Ingrown toenail Chronic ingrown toenail with recurrent bleeding and difficulty in trimming. She desires permanent removal of the toenail. Informed consent included numbing the toe, removing the nail, and applying phenol to prevent regrowth. Risk of recurrence despite procedure. - Schedule procedure for permanent removal of the ingrown toenail after her return from travel. - Provide post-procedure care instructions, including Epsom salt soaks and bandaging.  Ankle pain - On exam this does limit her activity.  She is interested in braces.  I had her evaluated by Lolita, pedorthist.  Discussed custom bracing but will start with off-the-shelf bracing bilateral Tri-Lock ankle braces were dispensed to help provide support and stability to the ankle.  Discussed stretching, range of motion exercises. -She is previously tried inserts which were ineffective. - If no improvement consider advanced imaging.   Donnice JONELLE Fees DPM

## 2024-05-20 NOTE — Progress Notes (Unsigned)
 Candace Medina

## 2024-05-20 NOTE — Progress Notes (Unsigned)
 PATIENT: Candace Medina DOB: Apr 08, 1956  REASON FOR VISIT: follow up HISTORY FROM: patient  No chief complaint on file.    HISTORY OF PRESENT ILLNESS:  05/20/24 ALL:  Zyaira returns for follow up for OSA on CPAP.   Trazodone    05/09/2023 ALL:  Candace Medina returns for follow up for OSA on CPAP. She was last seen 10/2022 and having difficulty maintaining sleep. We increased trazodone  to 100mg  at bedtime and encouraged regular use of CPAP. Since, she reports doing well. She is using CPAP nightly. She uses her travel machine when not using her home machine. She notes significant improvement in sleep quality and daytime energy levels when using CPAP. She feels increased dose of trazodone  has been helpful in helping her get to and maintaining sleep. She denies adverse effects. She has noted some increased grogginess over the past few weeks. She was started on sulfasalazine and feels it is making her sleepy. She has appt with rheumatology soon to discuss.      11/01/2022 ALL: Candace Medina returns for follow up for OSA on CPAP. She received a new CPAP machine. She feels that she is doing ok but having more dry mouth. She is not sure how to adjust humidity. She reports her FFM is not comfortable. It is the same mask that she uses with her travel machine but feels something is different. She has had more air leaking. She continues trazodone  at bedtime. She has been taking 50mg  most nights but requested increased dose to 100mg . She has tolerated increased dose well. She is also taking Pristiq 50mg  daily. Mood is stable. She does not take alprazolam regularly.     05/12/2022 ALL: Candace Medina is a 68 y.o. female here today for follow up for OSA on CPAP.  She is doing very well on therapy. She reports that she wakes feeling refreshed. CPAP has improved sleep quality as well. She originally received a traditional CPAP machine but then opted to trade it for a travel machine. She returned the original machine but  now wishes to get another.   Data reviewed day by day on Air mini app. Apneic events range from 0.9-3.4/hr. Usage looks good as a whole. No significant leak noted.   HISTORY: (copied from Dr Viola previous note)  Candace Medina is a 68 y.o.  Caucasian female patient seen here in a RV on 06/24/2021 : The patient was invited for a sleep study in April 2022 took place on 4-19 2022 and confirmed the patient had obstructive sleep apnea with prolonged hypoxemia most of her apneic events were actually hypopneas so qualified as shallow breathing rather than frank apnea.  The patient qualified as mild sleep apnea at 12.4 AHI overall there was no REM sleep dependency, in supine sleep her AHI was 26.2 so avoiding sleeping on the back could be very helpful.  Her oxygen nadir was 84% but the time spent in hypoxia was 84 minutes.  There was significant contribution during REM sleep noted.  We asked her to return for an attended CPAP titration.  This took place on 6-29 2022 also we wanted to have the opportunity to give oxygen if needed.  CPAP was initiated on the ResMed nasal mask and 20 and medium sized and was titrated from 5-8 cmH2O but the AHI became 0.0.  REM sleep rebounded to almost 40% of the night.  She could not recall that she was dreaming anything for a long time.  The AHI was significantly reduced and  we ordered an auto CPAP with a setting between 6 and 10 cmH2O no EPR heated humidification and the N 20 ResMed mask.  She had responded very well to 8 cm water pressure.  The patient then was actually finished with an auto titration device which she later exchanged for a travel CPAP.  The auto titration however she had used through the months of August through October and it showed 100% compliant user time with an average of 7 hours and 1 minute.  Her AutoSet was actually given with a 3 cm EPR also I had ordered no EPR.  The residual AHI was 1.9 the 95th percentile pressure was 9.8 cmH2O and her residual  apneas were vastly obstructive and not central in origin.   There were some higher air leaks with a nasal mask the 95th percentile air leak was qualified at 36.5 L/min.  She has been given a full facemask by choice but stated that the facial mask feels too small.  She was yesterday refitted to a medium size mask.    We need to set the travel machine now: and she used 10 cm water pressure w for most of the night- I will set it to 10 cm water but I am not sure she can get an EPR setting. If no EPR setting is possible on travel CPAP, will set to 8 cm water.    Her Migraines have improved her hypersomnia is still there, but is better.    REVIEW OF SYSTEMS: Out of a complete 14 system review of symptoms, the patient complains only of the following symptoms, arthritis, ringing in ears, hearing loss and all other reviewed systems are negative.  ESS: 7/24, previously 14/24 FSS: previously 44/63  ALLERGIES: Allergies  Allergen Reactions   Other Rash and Other (See Comments)    mosquitoes   Venomil Honey Bee Venom [Honey Bee Venom] Anaphylaxis    ALL VENOMOUS INSECTS    Boniva [Ibandronate]     Couldn't walk   Keflex [Cephalexin] Itching   Neosporin [Neomycin-Bacitracin Zn-Polymyx]    Tramadol     Only can take 1 tab a day   Codeine Rash   Morphine And Codeine Swelling and Rash    HOME MEDICATIONS: Outpatient Medications Prior to Visit  Medication Sig Dispense Refill   albuterol (PROVENTIL HFA;VENTOLIN HFA) 108 (90 BASE) MCG/ACT inhaler Inhale into the lungs every 6 (six) hours as needed for wheezing or shortness of breath.     ALPRAZolam (XANAX) 0.5 MG tablet Take 0.5 mg by mouth at bedtime as needed for anxiety or sleep.      budesonide-formoterol (SYMBICORT) 80-4.5 MCG/ACT inhaler Inhale 2 puffs into the lungs 2 (two) times daily.     desvenlafaxine (PRISTIQ) 50 MG 24 hr tablet Take 50 mg by mouth daily.     EPINEPHrine  0.3 mg/0.3 mL IJ SOAJ injection Inject 0.3 mg into the muscle as  needed for anaphylaxis.  1   ergocalciferol (VITAMIN D2) 50000 UNITS capsule Take 50,000 Units by mouth 2 (two) times a week.      fluticasone (FLONASE) 50 MCG/ACT nasal spray Place 1 spray into the nose daily as needed for allergies or rhinitis.      metFORMIN (GLUCOPHAGE) 1000 MG tablet Take 1,000 mg by mouth 2 (two) times daily with a meal.     MYRBETRIQ 25 MG TB24 tablet Take 25 mg by mouth daily.     OZEMPIC, 1 MG/DOSE, 4 MG/3ML SOPN INJECT 1 MG UNDER THE SKIN ONCE A  WEEK     pantoprazole (PROTONIX) 40 MG tablet Take 40 mg by mouth daily.     PROLIA  60 MG/ML SOSY injection  (Patient not taking: Reported on 04/02/2024)     propranolol ER (INDERAL LA) 60 MG 24 hr capsule Take 60 mg by mouth daily.     RESTASIS 0.05 % ophthalmic emulsion 1 drop 2 (two) times daily.     rosuvastatin (CRESTOR) 10 MG tablet Take 10 mg by mouth daily.     sulfaSALAzine (AZULFIDINE) 500 MG tablet Take 500 mg by mouth 2 (two) times daily.     traZODone  (DESYREL ) 100 MG tablet Take 1 tablet (100 mg total) by mouth at bedtime. 90 tablet 3   No facility-administered medications prior to visit.    PAST MEDICAL HISTORY: Past Medical History:  Diagnosis Date   Abnormal Pap smear of cervix    Ankylosing spondylitis (HCC)    Anxiety and depression    Arthritis    Asthma    Colon polyps    COPD (chronic obstructive pulmonary disease) (HCC)    Diabetes mellitus without complication (HCC)    Endometriosis    GERD (gastroesophageal reflux disease)    Hx of colonic polyps 12/30/2014   Hyperlipidemia    Internal hemorrhoids with prolapse - Gr 2 12/17/2014   Migraines    Nicotine addiction    Osteopenia    Osteoporosis    Plantar fasciitis    Urinary and fecal incontinence 05/17/2018   Vitamin D deficiency     PAST SURGICAL HISTORY: Past Surgical History:  Procedure Laterality Date   COLONOSCOPY     CRYOTHERAPY     for abnormal pap smear   HEMORRHOID BANDING  02/2015   LAPAROSCOPIC CHOLECYSTECTOMY  09/2017    Dr. Belinda   SHOULDER ARTHROSCOPY Right    bursitis   SHOULDER SURGERY Left    TONSILLECTOMY     TOTAL ABDOMINAL HYSTERECTOMY  1986   UPPER GASTROINTESTINAL ENDOSCOPY      FAMILY HISTORY: Family History  Problem Relation Age of Onset   Bladder Cancer Father    Esophageal cancer Mother    Melanoma Brother    Heart disease Other        mother's entire family-13 Bro and Sis   Brain cancer Paternal Aunt    Lung cancer Paternal Uncle    Bone cancer Paternal Uncle    Lung cancer Paternal Aunt    Stroke Maternal Grandmother    Diabetes Paternal Grandmother    Leukemia Paternal Aunt     SOCIAL HISTORY: Social History   Socioeconomic History   Marital status: Married    Spouse name: Not on file   Number of children: 2   Years of education: Not on file   Highest education level: Not on file  Occupational History   Occupation: [employer]  Tobacco Use   Smoking status: Every Day    Current packs/day: 2.00    Average packs/day: 2.0 packs/day for 43.0 years (86.0 ttl pk-yrs)    Types: Cigarettes   Smokeless tobacco: Never   Tobacco comments:    stopped today 09-19-2019  Substance and Sexual Activity   Alcohol  use: No    Alcohol /week: 0.0 standard drinks of alcohol    Drug use: No   Sexual activity: Not Currently    Birth control/protection: Surgical    Comment: hysterectomy  Other Topics Concern   Not on file  Social History Narrative   She is married, she has 1 son and one daughter  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[employer]   Drinks 3 caffeinated beverages daily   She enjoys painting as a hobby   3 grandchildren, she has custody of 1 granddaughter   Some college education   Social Drivers of Corporate investment banker Strain: Not on file  Food Insecurity: Not on file  Transportation Needs: Not on file  Physical Activity: Not on file  Stress: No Stress Concern Present (02/25/2022)   Received from [facility] -  Occupational Stress Questionnaire    Feeling of Stress : Not at all  Social Connections: Unknown (02/17/2022)   Received from [facility]   Social Network    Social Network: Not on file  Intimate Partner Violence: Unknown (02/17/2022)   Received from [facility]   HITS    Physically Hurt: Not on file    Insult or Talk Down To: Not on file    Threaten Physical Harm: Not on file    Scream or Curse: Not on file     PHYSICAL EXAM  There were no vitals filed for this visit.    There is no height or weight on file to calculate BMI.  Neck cir: 14.5  Generalized: Well developed, in no acute distress  Cardiology: normal rate and rhythm, no murmur noted Respiratory: clear to auscultation bilaterally  Neurological examination  Mentation: Alert oriented to time, place, history taking. Follows all commands speech and language fluent Cranial nerve II-XII: Pupils were equal round reactive to light. Extraocular movements were full, visual field were full  Motor: The motor testing reveals 5 over 5 strength of all 4 extremities. Good symmetric motor tone is noted throughout.  Gait and station: Gait is normal.    DIAGNOSTIC DATA (LABS, IMAGING, TESTING) - I reviewed patient records, labs, notes, testing and imaging myself where available.      No data to display           Lab Results  Component Value Date   WBC 8.6 01/24/2018   HGB 14.2 01/24/2018   HCT 43.0 01/24/2018   MCV 86.0 01/24/2018   PLT 300 01/24/2018      Component Value Date/Time   NA 140 01/24/2018 1610   K 3.7 01/24/2018 1610   CL 106 01/24/2018 1610   CO2 23 01/24/2018 1610   GLUCOSE 169 (H) 01/24/2018 1610   BUN 13 01/24/2018 1610   CREATININE 0.95 01/24/2018 1610   CALCIUM 9.5 01/24/2018 1610   GFRNONAA >60 01/24/2018 1610   GFRAA >60 01/24/2018 1610   No results found for: CHOL, HDL, LDLCALC, LDLDIRECT, TRIG, CHOLHDL No results found for: YHAJ8R No results found for: VITAMINB12 No  results found for: TSH   ASSESSMENT AND PLAN 68 y.o. year old female  has a past medical history of Abnormal Pap smear of cervix, Ankylosing spondylitis (HCC), Anxiety and depression, Arthritis, Asthma, Colon polyps, COPD (chronic obstructive pulmonary disease) (HCC), Diabetes mellitus without complication (HCC), Endometriosis, GERD (gastroesophageal reflux disease), colonic polyps (12/30/2014), Hyperlipidemia, Internal hemorrhoids with prolapse - Gr 2 (12/17/2014), Migraines, Nicotine addiction, Osteopenia, Osteoporosis, Plantar fasciitis, Urinary and fecal incontinence (05/17/2018), and Vitamin D deficiency. here with   No diagnosis found.    Candace Medina is doing well on CPAP therapy. Compliance report reveals sub optimal daily and 4 hour compliance, however, she used her travel machine while on vacation for 10 days. We discussed concerns of mask fit. I will send her for a mask  refitting. We will update supply orders as indicated. Risks of untreated sleep apnea review and education materials provided.she will continue trazodone  100mg  QHS. Discussed possible side effects including serotonin syndrome. She will monitor closely. She will discuss weaning Pristiq with PCP. Healthy lifestyle habits encouraged. She will follow up in 6 months. She verbalizes understanding and agreement with this plan.    No orders of the defined types were placed in this encounter.    No orders of the defined types were placed in this encounter.     Greig Forbes, FNP-C 05/20/2024, 9:26 AM [facility] [address], [address] [place], [place] [address] [phone]

## 2024-05-20 NOTE — Patient Instructions (Incomplete)
 Please continue using your CPAP regularly. While your insurance requires that you use CPAP at least 4 hours each night on 70% of the nights, I recommend, that you not skip any nights and use it throughout the night if you can. Getting used to CPAP and staying with the treatment long term does take time and patience and discipline. Untreated obstructive sleep apnea when it is moderate to severe can have an adverse impact on cardiovascular health and raise her risk for heart disease, arrhythmias, hypertension, congestive heart failure, stroke and diabetes. Untreated obstructive sleep apnea causes sleep disruption, nonrestorative sleep, and sleep deprivation. This can have an impact on your day to day functioning and cause daytime sleepiness and impairment of cognitive function, memory loss, mood disturbance, and problems focussing. Using CPAP regularly can improve these symptoms.  We will update supply orders, today. Continue trazodone  100mg  at bedtime. May try taking 50mg  at dinner and 50mg  at bedtime if you wish.   Follow up in 1 year

## 2024-05-21 ENCOUNTER — Encounter: Payer: Self-pay | Admitting: Family Medicine

## 2024-05-21 ENCOUNTER — Ambulatory Visit (INDEPENDENT_AMBULATORY_CARE_PROVIDER_SITE_OTHER): Payer: Federal, State, Local not specified - PPO | Admitting: Family Medicine

## 2024-05-21 VITALS — BP 122/82 | HR 87 | Ht 62.0 in | Wt 141.8 lb

## 2024-05-21 DIAGNOSIS — G4701 Insomnia due to medical condition: Secondary | ICD-10-CM

## 2024-05-21 DIAGNOSIS — G4733 Obstructive sleep apnea (adult) (pediatric): Secondary | ICD-10-CM | POA: Diagnosis not present

## 2024-05-21 DIAGNOSIS — G2581 Restless legs syndrome: Secondary | ICD-10-CM | POA: Diagnosis not present

## 2024-05-21 MED ORDER — TRAZODONE HCL 100 MG PO TABS: 100.0000 mg | ORAL_TABLET | Freq: Every day | ORAL | 3 refills | Status: AC

## 2024-06-05 ENCOUNTER — Telehealth: Payer: Self-pay

## 2024-06-05 NOTE — Telephone Encounter (Signed)
 Reached out to pt for her to call her DME Company [facility] to get her CPAP Supplies.

## 2024-06-05 NOTE — Telephone Encounter (Signed)
 I called the patient and provided the phone number for [facility] [phone]. Patient has no other questions or concerns.

## 2025-05-26 ENCOUNTER — Ambulatory Visit: Admitting: Family Medicine
